# Patient Record
Sex: Female | Born: 1935 | Race: Black or African American | Hispanic: No | Marital: Married | Smoking: Never smoker
Health system: Southern US, Community
[De-identification: ages and names within clinical notes are randomized; demographics above are authoritative.]

## PROBLEM LIST (undated history)

## (undated) DIAGNOSIS — E119 Type 2 diabetes mellitus without complications: Secondary | ICD-10-CM

---

## 1998-07-28 ENCOUNTER — Ambulatory Visit (HOSPITAL_COMMUNITY): Admission: RE | Admit: 1998-07-28 | Discharge: 1998-07-28 | Payer: Self-pay

## 1999-08-17 ENCOUNTER — Ambulatory Visit (HOSPITAL_COMMUNITY): Admission: RE | Admit: 1999-08-17 | Discharge: 1999-08-17 | Payer: Self-pay | Admitting: Hematology and Oncology

## 1999-08-17 ENCOUNTER — Encounter: Payer: Self-pay | Admitting: Hematology and Oncology

## 1999-09-27 ENCOUNTER — Encounter: Admission: RE | Admit: 1999-09-27 | Discharge: 1999-12-26 | Payer: Self-pay | Admitting: Internal Medicine

## 2000-02-02 ENCOUNTER — Encounter: Admission: RE | Admit: 2000-02-02 | Discharge: 2000-05-02 | Payer: Self-pay | Admitting: Internal Medicine

## 2000-02-03 ENCOUNTER — Encounter: Admission: RE | Admit: 2000-02-03 | Discharge: 2000-05-03 | Payer: Self-pay | Admitting: Internal Medicine

## 2000-08-17 ENCOUNTER — Encounter: Payer: Self-pay | Admitting: Hematology and Oncology

## 2000-08-17 ENCOUNTER — Ambulatory Visit (HOSPITAL_COMMUNITY): Admission: RE | Admit: 2000-08-17 | Discharge: 2000-08-17 | Payer: Self-pay | Admitting: Hematology and Oncology

## 2000-10-24 ENCOUNTER — Encounter: Admission: RE | Admit: 2000-10-24 | Discharge: 2001-01-22 | Payer: Self-pay | Admitting: Internal Medicine

## 2001-08-13 ENCOUNTER — Encounter: Payer: Self-pay | Admitting: Hematology and Oncology

## 2001-08-13 ENCOUNTER — Ambulatory Visit (HOSPITAL_COMMUNITY): Admission: RE | Admit: 2001-08-13 | Discharge: 2001-08-13 | Payer: Self-pay | Admitting: *Deleted

## 2002-08-16 ENCOUNTER — Encounter: Payer: Self-pay | Admitting: Internal Medicine

## 2002-08-16 ENCOUNTER — Ambulatory Visit (HOSPITAL_COMMUNITY): Admission: RE | Admit: 2002-08-16 | Discharge: 2002-08-16 | Payer: Self-pay | Admitting: Hematology and Oncology

## 2003-08-18 ENCOUNTER — Ambulatory Visit (HOSPITAL_COMMUNITY): Admission: RE | Admit: 2003-08-18 | Discharge: 2003-08-18 | Payer: Self-pay | Admitting: Internal Medicine

## 2004-08-18 ENCOUNTER — Ambulatory Visit (HOSPITAL_COMMUNITY): Admission: RE | Admit: 2004-08-18 | Discharge: 2004-08-18 | Payer: Self-pay | Admitting: Internal Medicine

## 2005-02-15 ENCOUNTER — Ambulatory Visit (HOSPITAL_COMMUNITY): Admission: RE | Admit: 2005-02-15 | Discharge: 2005-02-15 | Payer: Self-pay | Admitting: Internal Medicine

## 2005-07-27 ENCOUNTER — Ambulatory Visit (HOSPITAL_COMMUNITY): Admission: RE | Admit: 2005-07-27 | Discharge: 2005-07-27 | Payer: Self-pay | Admitting: Internal Medicine

## 2005-08-15 ENCOUNTER — Encounter: Admission: RE | Admit: 2005-08-15 | Discharge: 2005-08-15 | Payer: Self-pay | Admitting: Internal Medicine

## 2005-09-19 ENCOUNTER — Encounter: Admission: RE | Admit: 2005-09-19 | Discharge: 2005-09-19 | Payer: Self-pay | Admitting: Internal Medicine

## 2007-01-16 ENCOUNTER — Ambulatory Visit (HOSPITAL_COMMUNITY): Admission: RE | Admit: 2007-01-16 | Discharge: 2007-01-16 | Payer: Self-pay | Admitting: Internal Medicine

## 2008-01-17 ENCOUNTER — Ambulatory Visit (HOSPITAL_COMMUNITY): Admission: RE | Admit: 2008-01-17 | Discharge: 2008-01-17 | Payer: Self-pay | Admitting: Internal Medicine

## 2009-01-19 ENCOUNTER — Ambulatory Visit (HOSPITAL_COMMUNITY): Admission: RE | Admit: 2009-01-19 | Discharge: 2009-01-19 | Payer: Self-pay | Admitting: Surgical Oncology

## 2010-02-17 ENCOUNTER — Ambulatory Visit (HOSPITAL_COMMUNITY): Admission: RE | Admit: 2010-02-17 | Discharge: 2010-02-17 | Payer: Self-pay | Admitting: Internal Medicine

## 2010-06-13 ENCOUNTER — Encounter: Payer: Self-pay | Admitting: Internal Medicine

## 2011-03-15 ENCOUNTER — Other Ambulatory Visit (HOSPITAL_COMMUNITY): Payer: Self-pay | Admitting: Family Medicine

## 2011-03-15 DIAGNOSIS — Z1231 Encounter for screening mammogram for malignant neoplasm of breast: Secondary | ICD-10-CM

## 2011-04-06 ENCOUNTER — Ambulatory Visit (HOSPITAL_COMMUNITY)
Admission: RE | Admit: 2011-04-06 | Discharge: 2011-04-06 | Disposition: A | Discharge status: Home / Self Care | Payer: Medicare HMO | Source: Ambulatory Visit | Attending: Family Medicine | Admitting: Family Medicine

## 2011-04-06 DIAGNOSIS — Z1231 Encounter for screening mammogram for malignant neoplasm of breast: Secondary | ICD-10-CM

## 2013-04-04 ENCOUNTER — Other Ambulatory Visit (HOSPITAL_COMMUNITY): Payer: Self-pay | Admitting: Internal Medicine

## 2013-04-04 DIAGNOSIS — Z1231 Encounter for screening mammogram for malignant neoplasm of breast: Secondary | ICD-10-CM

## 2013-04-19 ENCOUNTER — Ambulatory Visit (HOSPITAL_COMMUNITY): Payer: Medicare Other

## 2013-05-08 ENCOUNTER — Ambulatory Visit (HOSPITAL_COMMUNITY)
Admission: RE | Admit: 2013-05-08 | Discharge: 2013-05-08 | Disposition: A | Discharge status: Home / Self Care | Payer: Medicare Other | Source: Ambulatory Visit | Attending: Internal Medicine | Admitting: Internal Medicine

## 2013-05-08 DIAGNOSIS — Z1231 Encounter for screening mammogram for malignant neoplasm of breast: Secondary | ICD-10-CM | POA: Insufficient documentation

## 2014-07-29 ENCOUNTER — Other Ambulatory Visit (HOSPITAL_BASED_OUTPATIENT_CLINIC_OR_DEPARTMENT_OTHER): Payer: Self-pay | Admitting: Internal Medicine

## 2014-07-29 DIAGNOSIS — Z1231 Encounter for screening mammogram for malignant neoplasm of breast: Secondary | ICD-10-CM

## 2014-08-05 ENCOUNTER — Ambulatory Visit (HOSPITAL_BASED_OUTPATIENT_CLINIC_OR_DEPARTMENT_OTHER): Payer: Medicare HMO

## 2018-01-27 ENCOUNTER — Encounter (HOSPITAL_COMMUNITY): Payer: Self-pay | Admitting: Emergency Medicine

## 2018-01-27 ENCOUNTER — Emergency Department (HOSPITAL_COMMUNITY): Payer: Medicare Other

## 2018-01-27 ENCOUNTER — Emergency Department (HOSPITAL_COMMUNITY)
Admission: EM | Admit: 2018-01-27 | Discharge: 2018-01-27 | Disposition: A | Discharge status: Home / Self Care | Payer: Medicare Other | Attending: Emergency Medicine | Admitting: Emergency Medicine

## 2018-01-27 DIAGNOSIS — S32592A Other specified fracture of left pubis, initial encounter for closed fracture: Secondary | ICD-10-CM | POA: Insufficient documentation

## 2018-01-27 DIAGNOSIS — Y998 Other external cause status: Secondary | ICD-10-CM | POA: Diagnosis not present

## 2018-01-27 DIAGNOSIS — Y929 Unspecified place or not applicable: Secondary | ICD-10-CM | POA: Diagnosis not present

## 2018-01-27 DIAGNOSIS — E119 Type 2 diabetes mellitus without complications: Secondary | ICD-10-CM | POA: Insufficient documentation

## 2018-01-27 DIAGNOSIS — Y9389 Activity, other specified: Secondary | ICD-10-CM | POA: Insufficient documentation

## 2018-01-27 DIAGNOSIS — S79912A Unspecified injury of left hip, initial encounter: Secondary | ICD-10-CM | POA: Diagnosis present

## 2018-01-27 DIAGNOSIS — W010XXA Fall on same level from slipping, tripping and stumbling without subsequent striking against object, initial encounter: Secondary | ICD-10-CM | POA: Diagnosis not present

## 2018-01-27 DIAGNOSIS — W19XXXA Unspecified fall, initial encounter: Secondary | ICD-10-CM

## 2018-01-27 HISTORY — DX: Type 2 diabetes mellitus without complications: E11.9

## 2018-01-27 MED ORDER — HYDROCODONE-ACETAMINOPHEN 5-325 MG PO TABS: 1.0000 | ORAL_TABLET | Freq: Four times a day (QID) | ORAL | 0 refills | Status: DC | PRN

## 2018-01-27 MED ORDER — HYDROCODONE-ACETAMINOPHEN 5-325 MG PO TABS
1.0000 | ORAL_TABLET | Freq: Once | ORAL | Status: AC
  Administered 2018-01-27: 1 via ORAL
  Filled 2018-01-27: qty 1

## 2018-01-27 NOTE — ED Provider Notes (Signed)
[facility] DEPT Provider Note   CSN: [id number] Arrival date & time: 01/27/18  1628     History   Chief Complaint Chief Complaint  Patient presents with  . Fall  . Hip Pain  . Leg Pain  . Ankle Pain    HPI Mary Aguirre is a 84 y.o. female.  HPI  84 year old female presents with left-sided leg and hip pain.  She fell 2 days ago while trying to get ice out of the freezer.  Has been unable to bear weight since.  Has tried Tylenol with partial relief.  However still in severe pain.  No known head injury and she is otherwise been well.  Has not been sick recently.  Pain is worst in the hip but diffuse throughout her left leg.  Also some back pain.  Past Medical History:  Diagnosis Date  . Diabetes mellitus without complication ([place])     There are no active problems to display for this patient.      OB History   None      Home Medications    Prior to Admission medications   Medication Sig Start Date End Date Taking? Authorizing Provider  HYDROcodone-acetaminophen (NORCO) 5-325 MG tablet Take 1 tablet by mouth every 6 (six) hours as needed for severe pain. 01/27/18   Sherwood Gambler, MD    Family History No family history on file.  Social History Social History   Tobacco Use  . Smoking status: Never Smoker  . Smokeless tobacco: Never Used  Substance Use Topics  . Alcohol use: Never    Frequency: Never  . Drug use: Never     Allergies   Iohexol   Review of Systems Review of Systems  Musculoskeletal: Positive for arthralgias and back pain.  Skin: Negative for wound.  Neurological: Negative for headaches.     Physical Exam Updated Vital Signs BP (!) 170/69   Pulse 81   Temp 98 F (36.7 C) (Oral)   Resp 17   SpO2 96%   Physical Exam  Constitutional: She appears well-developed and well-nourished.  HENT:  Head: Normocephalic and atraumatic.  Right Ear: External ear normal.  Left Ear: External ear normal.  Nose:  Nose normal.  Eyes: Right eye exhibits no discharge. Left eye exhibits no discharge.  Cardiovascular: Normal rate, regular rhythm and normal heart sounds.  Pulmonary/Chest: Effort normal and breath sounds normal.  Abdominal: Soft. She exhibits no distension. There is no tenderness.  Musculoskeletal:       Left hip: She exhibits decreased range of motion and tenderness.       Left knee: She exhibits no swelling. No tenderness found.       Left ankle: She exhibits normal range of motion and no swelling. Tenderness.       Cervical back: She exhibits no tenderness.       Thoracic back: She exhibits tenderness.       Lumbar back: She exhibits tenderness.       Left upper leg: She exhibits tenderness.       Left lower leg: She exhibits tenderness.       Left foot: There is tenderness.  Neurological: She is alert.  Skin: Skin is warm and dry.  Nursing note and vitals reviewed.    ED Treatments / Results  Labs (all labs ordered are listed, but only abnormal results are displayed) Labs Reviewed - No data to display  EKG None  Radiology Dg Thoracic Spine W/swimmers  Result  Date: 01/27/2018 CLINICAL DATA:  Pain after fall last week. EXAM: THORACIC SPINE - 3 VIEWS COMPARISON:  None. FINDINGS: Limited lateral views of the thoracic spine as images had to be acquired with the patient supine using cross-table technique due to patient's condition. There is moderate atherosclerosis of the thoracic aorta without aneurysm. Slight levoconvex curvature of the thoracic spine at the thoracolumbar junction. Thoracic spondylosis is noted with suggestion of slight multilevel degenerative inferior endplate compression and/or height loss from T7 through T10 which are age indeterminate but likely chronic given the multilevel disc flattening noted. IMPRESSION: Thoracic spondylosis with likely remote mild inferior endplate depressions of T7 through T10 suggested on the frontal view. The lateral views are limited in  utility due to the cross-table lateral technique that had to be utilized due to patient's condition. Electronically Signed   By: Ashley Royalty M.D.   On: 01/27/2018 19:36   Dg Lumbar Spine Complete  Result Date: 01/27/2018 CLINICAL DATA:  Left-sided pain after fall 1 week ago. EXAM: LUMBAR SPINE - COMPLETE 4+ VIEW COMPARISON:  CT 02/15/2005 and hip radiographs 10/13/2016 FINDINGS: Six views of the lumbar spine were provided however the lateral view is incomplete as the only compass is the inferior endplate L3 through mid coccyx. Conventional segmentation of the lumbar spine. Moderate nonaneurysmal aortoiliac atherosclerosis. There are acute slightly displaced left superior and inferior pubic rami fractures better visualized on the pelvic and hip radiographs. Chain sutures and surgical clips project over the midline lower pelvis. Sclerosis and slight erosive change about both SI joints compatible with chronic sacroiliitis. IMPRESSION: 1. Incomplete views of the upper lumbar spine on the lateral view as and excludes the upper lumbar spine from mid L3 cephalad. This was due to the patient's condition per technologist report and limited ability to position adequately. On the frontal projection however, no apparent fracture or suspicious osseous lesions. 2. Sclerosis and slight erosive change of both sacroiliac joints likely reflecting chronic sacroiliitis. 3. Left superior and inferior pubic rami fractures, acute in appearance with slight displacement. Electronically Signed   By: Ashley Royalty M.D.   On: 01/27/2018 19:32   Dg Tibia/fibula Left  Result Date: 01/27/2018 CLINICAL DATA:  Leg pain after fall 1 week ago. EXAM: LEFT TIBIA AND FIBULA - 2 VIEW COMPARISON:  None. FINDINGS: Osteopenic appearance of the tibia and fibula with vascular calcifications identified along the course the tibial arteries. Osteoarthritic joint space narrowing of the femorotibial and tibiotalar joints. Psych cortical depression of the  proximal tibial metadiaphysis along its medial aspect may reflect old remote trauma. Small calcaneal enthesophytes are noted. IMPRESSION: Osteopenia without acute appearing fracture or joint dislocation. Mild osteoarthritic joint space narrowing of the included knee and ankle joints. Electronically Signed   By: Ashley Royalty M.D.   On: 01/27/2018 19:43   Dg Ankle Complete Left  Result Date: 01/27/2018 CLINICAL DATA:  Ankle pain after fall 1 week ago. EXAM: LEFT ANKLE COMPLETE - 3+ VIEW COMPARISON:  None. FINDINGS: Osteopenic appearance of the left ankle with generalized soft tissue swelling of the included leg, ankle and foot. Calcaneal enthesopathy is seen more so along the posterior aspect. Joint spaces are maintained without effusion. No fracture is visualized. Tibial arteriosclerosis is noted. IMPRESSION: Generalized soft tissue swelling of the left ankle without acute osseous abnormality. Osteopenia. Electronically Signed   By: Ashley Royalty M.D.   On: 01/27/2018 19:45   Dg Foot Complete Left  Result Date: 01/27/2018 CLINICAL DATA:  Left foot pain after fall last  week. EXAM: LEFT FOOT - COMPLETE 3+ VIEW COMPARISON:  None. FINDINGS: Generalized osteopenic appearance of the foot and included ankle. Generalized soft tissue swelling is noted without fracture or joint dislocation. Dorsal calcaneal enthesophyte is identified. No ankle or subtalar joint effusion. Osteoarthritic joint space narrowing of the DIP and PIP joints of the toes, MTP articulations and interphalangeal joint of the great toe. Accessory ossicle is identified adjacent to the cuboid. IMPRESSION: 1. Diffuse soft tissue swelling of the ankle and foot possibly from vascular insufficiency or third spacing. 2. Osteopenic appearance of the included ankle and foot without fracture. 3. Osteoarthritis of the toes. Electronically Signed   By: Ashley Royalty M.D.   On: 01/27/2018 19:40   Dg Hip Unilat With Pelvis 2-3 Views Left  Result Date:  01/27/2018 CLINICAL DATA:  Patient fell 1 week ago and presents with left-sided pai. N EXAM: DG HIP (WITH OR WITHOUT PELVIS) 2-3V LEFT COMPARISON:  10/13/2016 FINDINGS: Acute slightly displaced left superior and inferior pubic rami fractures. The bony pelvis appears largely intact given limitations due to overlying bowel. Surgical clips project over the lower mid pelvis as before. Osteoarthritic joint space narrowing of both hips. No acute proximal femoral or acetabular fracture. Chronic sclerosis and slight erosive change about both SI joints suspicious for chronic changes of sacroiliitis. IMPRESSION: 1. Acute left inferior and superior pubic rami fractures with minimal displacement. 2. Osteoarthritis of both hips without acute proximal femoral nor acetabular fracture. 3. Chronic sclerosis and mild erosive change about the SI joints bilaterally suspicious for sacroiliitis. Electronically Signed   By: Ashley Royalty M.D.   On: 01/27/2018 19:27   Dg Femur Min 2 Views Left  Result Date: 01/27/2018 CLINICAL DATA:  Pain after fall 1 week ago EXAM: LEFT FEMUR 2 VIEWS COMPARISON:  None. FINDINGS: Acute, closed minimally displaced inferior and superior left-sided pubic rami fractures are noted. Osteoarthritis of the left hip joint with joint space narrowing. No fracture of the femur is identified. Mild joint space narrowing of the femorotibial compartment. Femoral arteriosclerosis is noted. Focal soft tissue swelling. Chain sutures and surgical clips project over the lower pelvis. IMPRESSION: Acute minimally displaced left inferior and superior pubic rami fractures. Osteoarthritis of the left hip and knee. No femoral fracture. Electronically Signed   By: Ashley Royalty M.D.   On: 01/27/2018 19:47    Procedures Procedures (including critical care time)  Medications Ordered in ED Medications  HYDROcodone-acetaminophen (NORCO/VICODIN) 5-325 MG per tablet 1 tablet (1 tablet Oral Given 01/27/18 1922)     Initial  Impression / Assessment and Plan / ED Course  I have reviewed the triage vital signs and the nursing notes.  Pertinent labs & imaging results that were available during my care of the patient were reviewed by me and considered in my medical decision making (see chart for details).     Patient's x-ray shows left inferior and superior pubic rami fractures.  However no hip fracture.  Other x-rays are either equivocal or no fracture.  I think all of this is related to the pubic rami fractures.  After hydrocodone, she is able to ambulate with a walker without difficulty.  She has a walker at home.  Discussed with Dr. Lorin Mercy he will follow-up in clinic and she is weightbearing as tolerated.  Otherwise no acute medical complaints and appears stable for discharge home.  Final Clinical Impressions(s) / ED Diagnoses   Final diagnoses:  Fall, initial encounter  Fracture of multiple pubic rami, left, closed, initial encounter (  [facility])    ED Discharge Orders         Ordered    HYDROcodone-acetaminophen (NORCO) 5-325 MG tablet  Every 6 hours PRN     01/27/18 2052           Sherwood Gambler, MD 01/27/18 2334

## 2018-01-27 NOTE — Discharge Instructions (Addendum)
If your pain worsens or becomes unbearable or you are unable to walk, or you develop any other new/concerning symptoms then return to the ER for evaluation.  Otherwise follow-up with the orthopedist listed.

## 2018-01-27 NOTE — ED Triage Notes (Signed)
Patient here from home with complaints of left sided pain after fall x1 week. Reports left hip, leg, and ankle pain.

## 2018-01-27 NOTE — ED Notes (Signed)
Patient transported to X-ray 

## 2018-04-18 ENCOUNTER — Emergency Department (HOSPITAL_COMMUNITY): Payer: Medicare Other

## 2018-04-18 ENCOUNTER — Encounter (HOSPITAL_COMMUNITY): Payer: Self-pay

## 2018-04-18 ENCOUNTER — Observation Stay (HOSPITAL_COMMUNITY)
Admission: EM | Admit: 2018-04-18 | Discharge: 2018-04-23 | Disposition: A | Discharge status: Skilled Nursing Facility | Payer: Medicare Other | Attending: Internal Medicine | Admitting: Internal Medicine

## 2018-04-18 ENCOUNTER — Other Ambulatory Visit: Payer: Self-pay

## 2018-04-18 DIAGNOSIS — Z992 Dependence on renal dialysis: Secondary | ICD-10-CM | POA: Diagnosis not present

## 2018-04-18 DIAGNOSIS — S32591A Other specified fracture of right pubis, initial encounter for closed fracture: Secondary | ICD-10-CM | POA: Diagnosis present

## 2018-04-18 DIAGNOSIS — Z79899 Other long term (current) drug therapy: Secondary | ICD-10-CM | POA: Diagnosis not present

## 2018-04-18 DIAGNOSIS — R296 Repeated falls: Secondary | ICD-10-CM | POA: Diagnosis not present

## 2018-04-18 DIAGNOSIS — M79671 Pain in right foot: Secondary | ICD-10-CM

## 2018-04-18 DIAGNOSIS — Z794 Long term (current) use of insulin: Secondary | ICD-10-CM | POA: Diagnosis not present

## 2018-04-18 DIAGNOSIS — N186 End stage renal disease: Secondary | ICD-10-CM | POA: Diagnosis present

## 2018-04-18 DIAGNOSIS — E118 Type 2 diabetes mellitus with unspecified complications: Secondary | ICD-10-CM | POA: Diagnosis not present

## 2018-04-18 DIAGNOSIS — S32592A Other specified fracture of left pubis, initial encounter for closed fracture: Principal | ICD-10-CM | POA: Insufficient documentation

## 2018-04-18 DIAGNOSIS — I120 Hypertensive chronic kidney disease with stage 5 chronic kidney disease or end stage renal disease: Secondary | ICD-10-CM | POA: Insufficient documentation

## 2018-04-18 DIAGNOSIS — S32511A Fracture of superior rim of right pubis, initial encounter for closed fracture: Secondary | ICD-10-CM

## 2018-04-18 DIAGNOSIS — W19XXXA Unspecified fall, initial encounter: Secondary | ICD-10-CM | POA: Diagnosis not present

## 2018-04-18 DIAGNOSIS — E1122 Type 2 diabetes mellitus with diabetic chronic kidney disease: Secondary | ICD-10-CM | POA: Insufficient documentation

## 2018-04-18 DIAGNOSIS — S329XXA Fracture of unspecified parts of lumbosacral spine and pelvis, initial encounter for closed fracture: Secondary | ICD-10-CM | POA: Diagnosis present

## 2018-04-18 DIAGNOSIS — W010XXA Fall on same level from slipping, tripping and stumbling without subsequent striking against object, initial encounter: Secondary | ICD-10-CM | POA: Diagnosis not present

## 2018-04-18 DIAGNOSIS — D631 Anemia in chronic kidney disease: Secondary | ICD-10-CM | POA: Diagnosis not present

## 2018-04-18 DIAGNOSIS — I10 Essential (primary) hypertension: Secondary | ICD-10-CM

## 2018-04-18 LAB — CBC WITH DIFFERENTIAL/PLATELET
Abs Immature Granulocytes: 0.05 10*3/uL (ref 0.00–0.07)
BASOS PCT: 0 %
Basophils Absolute: 0 10*3/uL (ref 0.0–0.1)
EOS PCT: 1 %
Eosinophils Absolute: 0.1 10*3/uL (ref 0.0–0.5)
HCT: 30.2 % — ABNORMAL LOW (ref 36.0–46.0)
Hemoglobin: 9 g/dL — ABNORMAL LOW (ref 12.0–15.0)
Immature Granulocytes: 1 %
LYMPHS ABS: 2 10*3/uL (ref 0.7–4.0)
Lymphocytes Relative: 21 %
MCH: 28 pg (ref 26.0–34.0)
MCHC: 29.8 g/dL — AB (ref 30.0–36.0)
MCV: 93.8 fL (ref 80.0–100.0)
MONO ABS: 0.7 10*3/uL (ref 0.1–1.0)
Monocytes Relative: 7 %
NRBC: 0 % (ref 0.0–0.2)
Neutro Abs: 6.6 10*3/uL (ref 1.7–7.7)
Neutrophils Relative %: 70 %
PLATELETS: 383 10*3/uL (ref 150–400)
RBC: 3.22 MIL/uL — AB (ref 3.87–5.11)
RDW: 14.5 % (ref 11.5–15.5)
WBC: 9.4 10*3/uL (ref 4.0–10.5)

## 2018-04-18 LAB — BASIC METABOLIC PANEL
Anion gap: 10 (ref 5–15)
BUN: 14 mg/dL (ref 8–23)
CO2: 26 mmol/L (ref 22–32)
CREATININE: 2.65 mg/dL — AB (ref 0.44–1.00)
Calcium: 9.7 mg/dL (ref 8.9–10.3)
Chloride: 103 mmol/L (ref 98–111)
GFR calc Af Amer: 18 mL/min — ABNORMAL LOW (ref >60)
GFR, EST NON AFRICAN AMERICAN: 15 mL/min — AB (ref >60)
Glucose, Bld: 114 mg/dL — ABNORMAL HIGH (ref 70–99)
Potassium: 3.6 mmol/L (ref 3.5–5.1)
SODIUM: 139 mmol/L (ref 135–145)

## 2018-04-18 MED ORDER — FENTANYL CITRATE (PF) 100 MCG/2ML IJ SOLN
50.0000 ug | Freq: Once | INTRAMUSCULAR | Status: AC
  Administered 2018-04-18: 50 ug via INTRAVENOUS
  Filled 2018-04-18: qty 2

## 2018-04-18 NOTE — H&P (Signed)
Mary Aguirre MHD:[id number] DOB: 29-Mar-1927 DOA: 04/18/2018     PCP: Drake Leach, MD   Outpatient Specialists:    NEphrology: at [facility].    Patient arrived to ER on 04/18/18 at 1750  Patient coming from: home Lives   With family    Chief Complaint:  Chief Complaint  Patient presents with  . Fall    HPI: Mary Aguirre is a 84 y.o. female with medical history significant of ESRD on HD, DM 2, HTN, anemia, hx of rectal cancer, hx of breast ca    Presented with a fall today while at HD.  Patient finished her hemodialysis session for today . she was sitting on her [place] reached over to the table to get apply slipped out of a walker and tumbled onto the floor and has recurrent falls and recently has been seen for fall with pelvic fracture few weeks ago that time she was standing up on a chair and trying to adjust curtains and fell down. Currently she is unable to ambulate reporting significant right hip pain denies LOC or head injury Reports this was a slip and fall landed on both knees  Reports her right toe hurts as well    Regarding pertinent Chronic problems:  Dm 2 on insulin  on HD M,W,F for the past 1 month still makes urine  While in ER: Got fentanyl IV with only minimal response continues to have significant pain and unable to ambulate The following Work up has been ordered so far:  Orders Placed This Encounter  Procedures  . DG Knee Complete 4 Views Left  . DG Knee Complete 4 Views Right  . DG Hip Unilat W or Wo Pelvis 2-3 Views Right  . CT Knee Right Wo Contrast  . CT Hip Right Wo Contrast  . CBC with Differential/Platelet  . Basic metabolic panel  . Ambulate patient  . Consult for [facility] Admission     Following Medications were ordered in ER: Medications  fentaNYL (SUBLIMAZE) injection 50 mcg (50 mcg Intravenous Given 04/18/18 2014)  fentaNYL (SUBLIMAZE) injection 50 mcg (50 mcg Intravenous Given 04/18/18 2257)     Significant initial  Findings: Abnormal Labs Reviewed  CBC WITH DIFFERENTIAL/PLATELET - Abnormal; Notable for the following components:      Result Value   RBC 3.22 (*)    Hemoglobin 9.0 (*)    HCT 30.2 (*)    MCHC 29.8 (*)    All other components within normal limits  BASIC METABOLIC PANEL - Abnormal; Notable for the following components:   Glucose, Bld 114 (*)    Creatinine, Ser 2.65 (*)    GFR calc non Af Amer 15 (*)    GFR calc Af Amer 18 (*)    All other components within normal limits     Lactic Acid, Venous No results found for: LATICACIDVEN  Na 139 K 3.6  Cr  Lab Results  Component Value Date   CREATININE 2.65 (H) 04/18/2018     WBC 9.4   HG/HCT stable,     Component Value Date/Time   HGB 9.0 (L) 04/18/2018 2007   HCT 30.2 (L) 04/18/2018 2007     Troponin (Point of Care Test) No results for input(s): TROPIPOC in the last 72 hours.   BNP (last 3 results) No results for input(s): BNP in the last 8760 hours.  ProBNP (last 3 results) No results for input(s): PROBNP in the last 8760 hours.     UA  not ordered    Left knee nonacute Right knee films and CT nonacute Pelvis chronic fractures of left superior and inferior pubic rami unchanged  CT right hip showing acute mildly displaced fracture of the right superior pubic ramus acute nondisplaced right inferior pubic ramus fracture subacute remote left pubic rami fractures soft tissue contusion of the right hip  ECG: not obtained    ED Triage Vitals  Enc Vitals Group     BP 04/18/18 1805 (!) 159/88     Pulse Rate 04/18/18 1805 (!) 107     Resp 04/18/18 1805 15     Temp 04/18/18 1805 98.7 F (37.1 C)     Temp Source 04/18/18 1805 Oral     SpO2 04/18/18 1805 99 %     Weight --      Height --      Head Circumference --      Peak Flow --      Pain Score 04/18/18 1804 10     Pain Loc --      Pain Edu? --      Excl. in [place]? --   TMAX(24)@       Latest  Blood pressure (!) 156/63, pulse  99, temperature 98.7 F (37.1 C), temperature source Oral, resp. rate 15, SpO2 93 %.   Hospitalist was called for admission for pain management of multiple pubic remi fractures Breast cancer sp mastectomy on left side  Review of Systems:    Pertinent positives include: fall, pelvic pain   Constitutional:  No weight loss, night sweats, Fevers, chills, fatigue, weight loss  HEENT:  No headaches, Difficulty swallowing,Tooth/dental problems,Sore throat,  No sneezing, itching, ear ache, nasal congestion, post nasal drip,  Cardio-vascular:  No chest pain, Orthopnea, PND, anasarca, dizziness, palpitations.no Bilateral lower extremity swelling  GI:  No heartburn, indigestion, abdominal pain, nausea, vomiting, diarrhea, change in bowel habits, loss of appetite, melena, blood in stool, hematemesis Resp:  no shortness of breath at rest. No dyspnea on exertion, No excess mucus, no productive cough, No non-productive cough, No coughing up of blood.No change in color of mucus.No wheezing. Skin:  no rash or lesions. No jaundice GU:  no dysuria, change in color of urine, no urgency or frequency. No straining to urinate.  No flank pain.  Musculoskeletal:  No joint pain or no joint swelling. No decreased range of motion. No back pain.  Psych:  No change in mood or affect. No depression or anxiety. No memory loss.  Neuro: no localizing neurological complaints, no tingling, no weakness, no double vision, no gait abnormality, no slurred speech, no confusion  All systems reviewed and apart from [place] all are negative  Past Medical History:   Past Medical History:  Diagnosis Date  . Diabetes mellitus without complication ([place])       History reviewed. No pertinent surgical history.  Social History:  Ambulatory  Gilford Rile     reports that she has never smoked. She has never used smokeless tobacco. She reports that she does not drink alcohol or use drugs.    Family History:   Family History   Problem Relation Age of Onset  . Colon cancer Brother   . Stroke Neg Hx     Allergies: Allergies  Allergen Reactions  . Cinacalcet Other (See Comments)  . Ramipril Other (See Comments)    unknown  . Iohexol      Desc: hives;pt pre-medicated with 13 hr prep did fine-02/15/05      Prior to  Admission medications   Medication Sig Start Date End Date Taking? Authorizing Provider  amLODipine (NORVASC) 5 MG tablet Take 5 mg by mouth daily. 02/15/18  Yes [provider]  atorvastatin (LIPITOR) 40 MG tablet Take 40 mg by mouth at bedtime. 01/23/18  Yes [provider]  bumetanide (BUMEX) 2 MG tablet Take 2 mg by mouth daily. 01/23/18  Yes [provider]  Insulin Detemir (LEVEMIR FLEXTOUCH) 100 UNIT/ML Pen Inject 30 Units into the skin at bedtime. 02/13/18  Yes [provider]  HYDROcodone-acetaminophen (NORCO) 5-325 MG tablet Take 1 tablet by mouth every 6 (six) hours as needed for severe pain. Patient not taking: Reported on 04/18/2018 01/27/18   Sherwood Gambler, MD   Physical Exam: Blood pressure (!) 156/63, pulse 99, temperature 98.7 F (37.1 C), temperature source Oral, resp. rate 15, SpO2 93 %. 1. General:  in No Acute distress  Chronically ill  -appearing 2. Psychological: Alert and    Oriented 3. Head/ENT:   Moist   Mucous Membranes                          Head Non traumatic, neck supple                        Poor Dentition 4. SKIN:   decreased Skin turgor,  Skin clean Dry and intact no rash 5. Heart: Regular rate and rhythm no  Murmur, no Rub or gallop 6. Lungs: Clear to auscultation bilaterally, no wheezes or crackles   7. Abdomen: Soft, non-tender, Non distended   obese  bowel sounds present 8. Lower extremities: no clubbing, cyanosis, or edema right great toe erythema slight tenderness to palpation over first metatarsal joint 9. Neurologically Grossly intact, moving all 4 extremities equally  10. MSK: Normal range of motion   LABS:      Recent Labs  Lab 04/18/18 2007  WBC 9.4  NEUTROABS 6.6  HGB 9.0*  HCT 30.2*  MCV 93.8  PLT 370   Basic Metabolic Panel: Recent Labs  Lab 04/18/18 2007  NA 139  K 3.6  CL 103  CO2 26  GLUCOSE 114*  BUN 14  CREATININE 2.65*  CALCIUM 9.7     No results for input(s): AST, ALT, ALKPHOS, BILITOT, PROT, ALBUMIN in the last 168 hours. No results for input(s): LIPASE, AMYLASE in the last 168 hours. No results for input(s): AMMONIA in the last 168 hours.    HbA1C: No results for input(s): HGBA1C in the last 72 hours. CBG: No results for input(s): GLUCAP in the last 168 hours.    Urine analysis: No results found for: COLORURINE, APPEARANCEUR, LABSPEC, PHURINE, GLUCOSEU, HGBUR, BILIRUBINUR, KETONESUR, PROTEINUR, UROBILINOGEN, NITRITE, LEUKOCYTESUR     Cultures: No results found for: SDES, [place], CULT, REPTSTATUS   Radiological Exams on Admission: Ct Knee Right Wo Contrast  Result Date: 04/18/2018 CLINICAL DATA:  Fall with right-sided knee pain EXAM: CT OF THE right KNEE WITHOUT CONTRAST TECHNIQUE: Multidetector CT imaging of the right knee was performed according to the standard protocol. Multiplanar CT image reconstructions were also generated. COMPARISON:  04/18/2018, 04/02/2018 FINDINGS: Bones/Joint/Cartilage No acute displaced fracture or malalignment. Mild patellofemoral degenerative changes. Mild medial and lateral joint space degenerative change. No significant knee effusion. Small cortical defect proximal metaphysis of the tibia, corresponding to radiographic abnormality. This could be secondary to remote trauma. No associated soft tissue mass. No periostitis. Ligaments Suboptimally assessed by CT. Muscles and Tendons  Quadriceps and patellar tendon appear intact. No intramuscular hematoma. Mild muscular atrophy. Soft tissues Vascular calcifications in the popliteal fossa. IMPRESSION: 1. No acute displaced fracture or malalignment 2. Mild arthritis of the knee 3.  Small cortical defect proximal metaphysis of the tibia without associated soft tissue mass. This is of uncertain etiology, possibly a small focus of remote trauma. Electronically Signed   By: Donavan Foil M.D.   On: 04/18/2018 23:10   Ct Hip Right Wo Contrast  Result Date: 04/18/2018 CLINICAL DATA:  84 year old post fall while at dialysis today. Right pelvic pain. EXAM: CT OF THE RIGHT HIP WITHOUT CONTRAST TECHNIQUE: Multidetector CT imaging of the right hip was performed according to the standard protocol. Multiplanar CT image reconstructions were also generated. COMPARISON:  Radiograph earlier this day. Pelvis and right hip radiographs 04/02/2018 FINDINGS: Bones/Joint/Cartilage Acute mildly displaced fracture of the right puboacetabular junction. This does not extend to the acetabular articular cortex. Acute the right femoral head is well seated. Nondisplaced right inferior pubic ramus fracture. Full field of view imaging of the pelvis demonstrates a comminuted left superior pubic ramus fracture and displaced left inferior pubis ramus fracture, seen on 01/27/2018 radiograph. Fracture component at the left puboacetabular junction is likely chronic but technically age indeterminate. Bilateral sacroiliac joint widening appears chronic and may be secondary to renal osteodystrophy. There is pubic symphyseal widening which is also chronic. Diffuse bony under mineralization. Ligaments Suboptimally assessed by CT. Muscles and Tendons No intramuscular hematoma.  Enthesopathy at the greater trochanter. Soft tissues Soft tissue contusion lateral to the right hip. Colonic diverticulosis. Advanced pelvic vascular calcifications. Enteric sutures in the rectosigmoid colon. IMPRESSION: 1. Acute mildly displaced fracture of the right superior pubic ramus at the puboacetabular junction. Acute nondisplaced right inferior pubic ramus fracture. 2. Subacute/remote left pubic rami fractures, present since at least 01/27/2018 left  hip radiograph. 3. Soft tissue contusion lateral to the right hip. 4. Additional chronic findings as described. Electronically Signed   By: Keith Rake M.D.   On: 04/18/2018 23:09   Dg Knee Complete 4 Views Left  Result Date: 04/18/2018 CLINICAL DATA:  [place] today EXAM: LEFT KNEE - COMPLETE 4+ VIEW COMPARISON:  01/27/2018 FINDINGS: Osseous demineralization. Joint spaces preserved. Subtle cortical deformity at the medial aspect of the LEFT tibial metadiaphysis, less pronounced than on RIGHT, likely benign or old. No acute fracture, dislocation, or bone destruction. No knee joint effusion. IMPRESSION: No acute osseous abnormalities. Electronically Signed   By: Lavonia Dana M.D.   On: 04/18/2018 19:16   Dg Knee Complete 4 Views Right  Result Date: 04/18/2018 CLINICAL DATA:  [place] today EXAM: RIGHT KNEE - COMPLETE 4+ VIEW COMPARISON:  04/02/2018 FINDINGS: Diffuse osseous demineralization. Joint spaces preserved. Chronic deformity of the cortex at the medial aspect of the proximal tibial metadiaphysis, unchanged from previous exam. No interval bony resorption or periosteal reaction to suggest this is an acute finding. No acute fracture, dislocation or bone destruction. No knee joint effusion. IMPRESSION: Osseous demineralization. Chronic cortical deformity at the medial aspect of the proximal tibial metadiaphysis; the lack of interval change since the prior study of 04/02/2018 suggest this is an old rather than evolving acute finding. No definite acute bony abnormalities. If patient has persistent pain referable to the proximal tibia, recommend CT or MR assessment. Electronically Signed   By: Lavonia Dana M.D.   On: 04/18/2018 19:14   Dg Hip Unilat W Or Wo Pelvis 2-3 Views Right  Result Date: 04/18/2018 CLINICAL DATA:  Fall EXAM:  DG HIP (WITH OR WITHOUT PELVIS) 2-3V RIGHT COMPARISON:  04/02/2018, 02/28/2018, on 01/27/2018 FINDINGS: Osseous demineralization. Hip joint spaces preserved. SI joints are  inadequately visualized due to degree of bony demineralization. Fractures of the LEFT superior and inferior pubic rami again identified with significant resorption at the fractures since 01/27/2018. Additional fracture plane laterally at the LEFT superior pubic ramus was not visualized on 01/27/2018 but is present on 04/02/2018. No additional fracture, dislocation or bone destruction. IMPRESSION: Fractures of the LEFT superior and inferior pubic rami as above, unchanged. Electronically Signed   By: Lavonia Dana M.D.   On: 04/18/2018 19:20    Chart has been reviewed    Assessment/Plan  84 y.o. female with medical history significant of ESRD on HD, DM 2, HTN, anemia, hx of rectal cancer, hx of breast ca     Admitted for pain management of pelvic fractures  Present on Admission: . Pelvic fracture ([place]) admit for pain management titrate p.o. medications and for breakthrough use IV morphine given severity of pain will need PT OT evaluation may require placement given repeated falls will need for rehab . DM (diabetes mellitus), type 2 with complications ([facility]) -  - Order Sensitive  SSI   - continue home insulin regimen 30 units,  -  check TSH and HgA1C     . ESRD (end stage renal disease) ([place]) - HD on M,W,F  If still here on Friday will need to notify nephrology that patient has been admitted  . Essential hypertension stable continue home medications  Right foot with pain -will obtain plain imaging to further evaluate  Other plan as per orders.  DVT prophylaxis:  Hep [place]  Code Status:  FULL CODE   as per patient  And family  I had personally discussed CODE STATUS with patient and family     Family Communication:   Family  at  Bedside  plan of care was discussed with grandDaughter,  Disposition Plan:     likely will need placement for rehabilitation                    Would benefit from PT/OT eval prior to DC  Ordered                                       Consults called: none    Admission status:   Obs    Level of care      medical floor         Toy Baker 04/19/2018, 12:37 AM    [facility]  Pager [phone]   after 2 AM please page floor coverage PA If 7AM-7PM, please contact the day team taking care of the patient  [email or web address]  Password [email or web address]

## 2018-04-18 NOTE — ED Triage Notes (Signed)
Pt c/o fall that she had today at HD. Pt has recent hx of pelvis fx from fall that happen a couple of weeks. Pt unable to ambulate. Pt c/o pain in right hip and bilateral knees. Pt denies hitting her head or LOC.

## 2018-04-18 NOTE — ED Provider Notes (Signed)
[facility] EMERGENCY DEPARTMENT Provider Note   CSN: [id number] Arrival date & time: 04/18/18  1750     History   Chief Complaint Chief Complaint  Patient presents with  . Fall    HPI Mary Aguirre is a 84 y.o. female.  HPI Patient had a slip and fall while at hemodialysis today landing on both knees.  She is complaining of bilateral knee pain and right hip pain.  She is had several recent falls and was diagnosed with left inferior and superior pubic rami fractures.  Patient denies any head injury.  No focal weakness or numbness. Past Medical History:  Diagnosis Date  . Diabetes mellitus without complication ([place])     There are no active problems to display for this patient.   History reviewed. No pertinent surgical history.   OB History   None      Home Medications    Prior to Admission medications   Medication Sig Start Date End Date Taking? Authorizing Provider  amLODipine (NORVASC) 5 MG tablet Take 5 mg by mouth daily. 02/15/18  Yes [provider]  atorvastatin (LIPITOR) 40 MG tablet Take 40 mg by mouth at bedtime. 01/23/18  Yes [provider]  bumetanide (BUMEX) 2 MG tablet Take 2 mg by mouth daily. 01/23/18  Yes [provider]  Insulin Detemir (LEVEMIR FLEXTOUCH) 100 UNIT/ML Pen Inject 30 Units into the skin at bedtime. 02/13/18  Yes [provider]  HYDROcodone-acetaminophen (NORCO) 5-325 MG tablet Take 1 tablet by mouth every 6 (six) hours as needed for severe pain. Patient not taking: Reported on 04/18/2018 01/27/18   Sherwood Gambler, MD    Family History No family history on file.  Social History Social History   Tobacco Use  . Smoking status: Never Smoker  . Smokeless tobacco: Never Used  Substance Use Topics  . Alcohol use: Never    Frequency: Never  . Drug use: Never     Allergies   Cinacalcet; Ramipril; and Iohexol   Review of Systems Review of Systems  Constitutional: Negative for  chills and fever.  Respiratory: Negative for cough and shortness of breath.   Cardiovascular: Negative for chest pain.  Gastrointestinal: Negative for abdominal pain, diarrhea, nausea and vomiting.  Genitourinary: Negative for dysuria and flank pain.  Musculoskeletal: Positive for arthralgias. Negative for back pain, myalgias and neck pain.  Skin: Negative for rash and wound.  Neurological: Negative for dizziness, weakness, light-headedness, numbness and headaches.     Physical Exam Updated Vital Signs BP (!) 164/64   Pulse 95   Temp 98.7 F (37.1 C) (Oral)   Resp 15   SpO2 97%   Physical Exam  Constitutional: She is oriented to person, place, and time. She appears well-developed and well-nourished. No distress.  HENT:  Head: Normocephalic and atraumatic.  Mouth/Throat: Oropharynx is clear and moist. No oropharyngeal exudate.  Eyes: Pupils are equal, round, and reactive to light. EOM are normal.  Neck: Normal range of motion. Neck supple. No JVD present.  Cardiovascular: Normal rate and regular rhythm.  Pulmonary/Chest: Effort normal and breath sounds normal.  Abdominal: Soft. Bowel sounds are normal. There is no tenderness. There is no rebound and no guarding.  Musculoskeletal: She exhibits tenderness. She exhibits no edema.  Patient with diminished range of motion of bilateral knees and and right hip due to pain.  Generally tender to palpation of her bilateral knees without obvious deformity or effusion.  Distal pulses are intact.  Right leg appears  mildly shortened and externally rotated.  Pelvis is stable.  Neurological: She is alert and oriented to person, place, and time.  Skin: Skin is warm and dry. Capillary refill takes less than 2 seconds. No rash noted. She is not diaphoretic. No erythema.  Psychiatric: She has a normal mood and affect. Her behavior is normal.  Nursing note and vitals reviewed.    ED Treatments / Results  Labs (all labs ordered are listed, but only  abnormal results are displayed) Labs Reviewed  CBC WITH DIFFERENTIAL/PLATELET - Abnormal; Notable for the following components:      Result Value   RBC 3.22 (*)    Hemoglobin 9.0 (*)    HCT 30.2 (*)    MCHC 29.8 (*)    All other components within normal limits  BASIC METABOLIC PANEL - Abnormal; Notable for the following components:   Glucose, Bld 114 (*)    Creatinine, Ser 2.65 (*)    GFR calc non Af Amer 15 (*)    GFR calc Af Amer 18 (*)    All other components within normal limits    EKG None  Radiology Ct Knee Right Wo Contrast  Result Date: 04/18/2018 CLINICAL DATA:  Fall with right-sided knee pain EXAM: CT OF THE right KNEE WITHOUT CONTRAST TECHNIQUE: Multidetector CT imaging of the right knee was performed according to the standard protocol. Multiplanar CT image reconstructions were also generated. COMPARISON:  04/18/2018, 04/02/2018 FINDINGS: Bones/Joint/Cartilage No acute displaced fracture or malalignment. Mild patellofemoral degenerative changes. Mild medial and lateral joint space degenerative change. No significant knee effusion. Small cortical defect proximal metaphysis of the tibia, corresponding to radiographic abnormality. This could be secondary to remote trauma. No associated soft tissue mass. No periostitis. Ligaments Suboptimally assessed by CT. Muscles and Tendons Quadriceps and patellar tendon appear intact. No intramuscular hematoma. Mild muscular atrophy. Soft tissues Vascular calcifications in the popliteal fossa. IMPRESSION: 1. No acute displaced fracture or malalignment 2. Mild arthritis of the knee 3. Small cortical defect proximal metaphysis of the tibia without associated soft tissue mass. This is of uncertain etiology, possibly a small focus of remote trauma. Electronically Signed   By: Donavan Foil M.D.   On: 04/18/2018 23:10   Ct Hip Right Wo Contrast  Result Date: 04/18/2018 CLINICAL DATA:  84 year old post fall while at dialysis today. Right pelvic  pain. EXAM: CT OF THE RIGHT HIP WITHOUT CONTRAST TECHNIQUE: Multidetector CT imaging of the right hip was performed according to the standard protocol. Multiplanar CT image reconstructions were also generated. COMPARISON:  Radiograph earlier this day. Pelvis and right hip radiographs 04/02/2018 FINDINGS: Bones/Joint/Cartilage Acute mildly displaced fracture of the right puboacetabular junction. This does not extend to the acetabular articular cortex. Acute the right femoral head is well seated. Nondisplaced right inferior pubic ramus fracture. Full field of view imaging of the pelvis demonstrates a comminuted left superior pubic ramus fracture and displaced left inferior pubis ramus fracture, seen on 01/27/2018 radiograph. Fracture component at the left puboacetabular junction is likely chronic but technically age indeterminate. Bilateral sacroiliac joint widening appears chronic and may be secondary to renal osteodystrophy. There is pubic symphyseal widening which is also chronic. Diffuse bony under mineralization. Ligaments Suboptimally assessed by CT. Muscles and Tendons No intramuscular hematoma.  Enthesopathy at the greater trochanter. Soft tissues Soft tissue contusion lateral to the right hip. Colonic diverticulosis. Advanced pelvic vascular calcifications. Enteric sutures in the rectosigmoid colon. IMPRESSION: 1. Acute mildly displaced fracture of the right superior pubic ramus at the puboacetabular  junction. Acute nondisplaced right inferior pubic ramus fracture. 2. Subacute/remote left pubic rami fractures, present since at least 01/27/2018 left hip radiograph. 3. Soft tissue contusion lateral to the right hip. 4. Additional chronic findings as described. Electronically Signed   By: Keith Rake M.D.   On: 04/18/2018 23:09   Dg Knee Complete 4 Views Left  Result Date: 04/18/2018 CLINICAL DATA:  [place] today EXAM: LEFT KNEE - COMPLETE 4+ VIEW COMPARISON:  01/27/2018 FINDINGS: Osseous  demineralization. Joint spaces preserved. Subtle cortical deformity at the medial aspect of the LEFT tibial metadiaphysis, less pronounced than on RIGHT, likely benign or old. No acute fracture, dislocation, or bone destruction. No knee joint effusion. IMPRESSION: No acute osseous abnormalities. Electronically Signed   By: Lavonia Dana M.D.   On: 04/18/2018 19:16   Dg Knee Complete 4 Views Right  Result Date: 04/18/2018 CLINICAL DATA:  [place] today EXAM: RIGHT KNEE - COMPLETE 4+ VIEW COMPARISON:  04/02/2018 FINDINGS: Diffuse osseous demineralization. Joint spaces preserved. Chronic deformity of the cortex at the medial aspect of the proximal tibial metadiaphysis, unchanged from previous exam. No interval bony resorption or periosteal reaction to suggest this is an acute finding. No acute fracture, dislocation or bone destruction. No knee joint effusion. IMPRESSION: Osseous demineralization. Chronic cortical deformity at the medial aspect of the proximal tibial metadiaphysis; the lack of interval change since the prior study of 04/02/2018 suggest this is an old rather than evolving acute finding. No definite acute bony abnormalities. If patient has persistent pain referable to the proximal tibia, recommend CT or MR assessment. Electronically Signed   By: Lavonia Dana M.D.   On: 04/18/2018 19:14   Dg Hip Unilat W Or Wo Pelvis 2-3 Views Right  Result Date: 04/18/2018 CLINICAL DATA:  Fall EXAM: DG HIP (WITH OR WITHOUT PELVIS) 2-3V RIGHT COMPARISON:  04/02/2018, 02/28/2018, on 01/27/2018 FINDINGS: Osseous demineralization. Hip joint spaces preserved. SI joints are inadequately visualized due to degree of bony demineralization. Fractures of the LEFT superior and inferior pubic rami again identified with significant resorption at the fractures since 01/27/2018. Additional fracture plane laterally at the LEFT superior pubic ramus was not visualized on 01/27/2018 but is present on 04/02/2018. No additional fracture,  dislocation or bone destruction. IMPRESSION: Fractures of the LEFT superior and inferior pubic rami as above, unchanged. Electronically Signed   By: Lavonia Dana M.D.   On: 04/18/2018 19:20    Procedures Procedures (including critical care time)  Medications Ordered in ED Medications  fentaNYL (SUBLIMAZE) injection 50 mcg (50 mcg Intravenous Given 04/18/18 2014)  fentaNYL (SUBLIMAZE) injection 50 mcg (50 mcg Intravenous Given 04/18/18 2257)     Initial Impression / Assessment and Plan / ED Course  I have reviewed the triage vital signs and the nursing notes.  Pertinent labs & imaging results that were available during my care of the patient were reviewed by me and considered in my medical decision making (see chart for details).     CT with evidence of superior and inferior pubic rami fracture.  Patient given multiple doses of pain medication and still unable to ambulate.  Discussed with hospitalist regarding admission for likely rehab placement.  Final Clinical Impressions(s) / ED Diagnoses   Final diagnoses:  Fall, initial encounter  Closed fracture of superior ramus of right pubis, initial encounter ([place])  Closed fracture of right inferior pubic ramus, initial encounter [facility])    ED Discharge Orders    None       Julianne Rice, MD 04/18/18 2330

## 2018-04-18 NOTE — ED Notes (Signed)
Patient transported to CT 

## 2018-04-18 NOTE — ED Notes (Signed)
IV access attempted without success, [place], RN to attempt.

## 2018-04-18 NOTE — ED Notes (Signed)
Family at bedside. 

## 2018-04-19 ENCOUNTER — Encounter (HOSPITAL_COMMUNITY): Payer: Self-pay | Admitting: Internal Medicine

## 2018-04-19 ENCOUNTER — Observation Stay (HOSPITAL_COMMUNITY): Payer: Medicare Other

## 2018-04-19 DIAGNOSIS — E118 Type 2 diabetes mellitus with unspecified complications: Secondary | ICD-10-CM | POA: Diagnosis not present

## 2018-04-19 DIAGNOSIS — I10 Essential (primary) hypertension: Secondary | ICD-10-CM | POA: Diagnosis present

## 2018-04-19 DIAGNOSIS — N186 End stage renal disease: Secondary | ICD-10-CM | POA: Diagnosis not present

## 2018-04-19 DIAGNOSIS — S32591A Other specified fracture of right pubis, initial encounter for closed fracture: Secondary | ICD-10-CM | POA: Diagnosis not present

## 2018-04-19 LAB — COMPREHENSIVE METABOLIC PANEL
ALBUMIN: 3 g/dL — AB (ref 3.5–5.0)
ALT: 15 U/L (ref 0–44)
AST: 26 U/L (ref 15–41)
Alkaline Phosphatase: 248 U/L — ABNORMAL HIGH (ref 38–126)
Anion gap: 10 (ref 5–15)
BUN: 18 mg/dL (ref 8–23)
CO2: 25 mmol/L (ref 22–32)
Calcium: 9.4 mg/dL (ref 8.9–10.3)
Chloride: 103 mmol/L (ref 98–111)
Creatinine, Ser: 3.15 mg/dL — ABNORMAL HIGH (ref 0.44–1.00)
GFR calc Af Amer: 14 mL/min — ABNORMAL LOW (ref >60)
GFR calc non Af Amer: 12 mL/min — ABNORMAL LOW (ref >60)
Glucose, Bld: 190 mg/dL — ABNORMAL HIGH (ref 70–99)
Potassium: 3.5 mmol/L (ref 3.5–5.1)
Sodium: 138 mmol/L (ref 135–145)
Total Bilirubin: 0.6 mg/dL (ref 0.3–1.2)
Total Protein: 6 g/dL — ABNORMAL LOW (ref 6.5–8.1)

## 2018-04-19 LAB — PHOSPHORUS: PHOSPHORUS: 5.2 mg/dL — AB (ref 2.5–4.6)

## 2018-04-19 LAB — GLUCOSE, CAPILLARY
GLUCOSE-CAPILLARY: 102 mg/dL — AB (ref 70–99)
GLUCOSE-CAPILLARY: 181 mg/dL — AB (ref 70–99)
Glucose-Capillary: 66 mg/dL — ABNORMAL LOW (ref 70–99)
Glucose-Capillary: 159 mg/dL — ABNORMAL HIGH (ref 70–99)
Glucose-Capillary: 108 mg/dL — ABNORMAL HIGH (ref 70–99)
Glucose-Capillary: 114 mg/dL — ABNORMAL HIGH (ref 70–99)

## 2018-04-19 LAB — HEMOGLOBIN A1C
Hgb A1c MFr Bld: 5.6 % (ref 4.8–5.6)
Mean Plasma Glucose: 114.02 mg/dL

## 2018-04-19 LAB — CBC
HEMATOCRIT: 26.3 % — AB (ref 36.0–46.0)
Hemoglobin: 8.4 g/dL — ABNORMAL LOW (ref 12.0–15.0)
MCH: 29 pg (ref 26.0–34.0)
MCHC: 31.9 g/dL (ref 30.0–36.0)
MCV: 90.7 fL (ref 80.0–100.0)
Platelets: 355 10*3/uL (ref 150–400)
RBC: 2.9 MIL/uL — ABNORMAL LOW (ref 3.87–5.11)
RDW: 14.4 % (ref 11.5–15.5)
WBC: 8.8 10*3/uL (ref 4.0–10.5)
nRBC: 0 % (ref 0.0–0.2)

## 2018-04-19 LAB — TSH: TSH: 1.225 u[IU]/mL (ref 0.350–4.500)

## 2018-04-19 LAB — MAGNESIUM: Magnesium: 1.8 mg/dL (ref 1.7–2.4)

## 2018-04-19 MED ORDER — MORPHINE SULFATE (PF) 2 MG/ML IV SOLN
2.0000 mg | INTRAVENOUS | Status: DC | PRN
  Filled 2018-04-19: qty 1

## 2018-04-19 MED ORDER — ONDANSETRON HCL 4 MG/2ML IJ SOLN: 4.0000 mg | Freq: Four times a day (QID) | INTRAMUSCULAR | Status: DC | PRN

## 2018-04-19 MED ORDER — ACETAMINOPHEN 325 MG PO TABS
650.0000 mg | ORAL_TABLET | Freq: Four times a day (QID) | ORAL | Status: DC | PRN
  Administered 2018-04-19: 650 mg via ORAL
  Filled 2018-04-19: qty 2

## 2018-04-19 MED ORDER — HYDROCODONE-ACETAMINOPHEN 5-325 MG PO TABS
1.0000 | ORAL_TABLET | ORAL | Status: DC | PRN
  Administered 2018-04-19 – 2018-04-21 (×3): 1 via ORAL
  Filled 2018-04-19 (×3): qty 1

## 2018-04-19 MED ORDER — INSULIN ASPART 100 UNIT/ML ~~LOC~~ SOLN
0.0000 [IU] | Freq: Three times a day (TID) | SUBCUTANEOUS | Status: DC
  Administered 2018-04-20: 1 [IU] via SUBCUTANEOUS
  Administered 2018-04-21: 2 [IU] via SUBCUTANEOUS

## 2018-04-19 MED ORDER — ONDANSETRON HCL 4 MG PO TABS
4.0000 mg | ORAL_TABLET | Freq: Four times a day (QID) | ORAL | Status: DC | PRN
  Administered 2018-04-21: 4 mg via ORAL
  Filled 2018-04-19: qty 1

## 2018-04-19 MED ORDER — QUETIAPINE FUMARATE 25 MG PO TABS
25.0000 mg | ORAL_TABLET | Freq: Every evening | ORAL | Status: DC | PRN
  Filled 2018-04-19: qty 1

## 2018-04-19 MED ORDER — HEPARIN SODIUM (PORCINE) 5000 UNIT/ML IJ SOLN
5000.0000 [IU] | Freq: Three times a day (TID) | INTRAMUSCULAR | Status: DC
  Administered 2018-04-19 – 2018-04-22 (×10): 5000 [IU] via SUBCUTANEOUS
  Filled 2018-04-19 (×11): qty 1

## 2018-04-19 MED ORDER — ATORVASTATIN CALCIUM 40 MG PO TABS
40.0000 mg | ORAL_TABLET | Freq: Every day | ORAL | Status: DC
  Administered 2018-04-19 – 2018-04-22 (×5): 40 mg via ORAL
  Filled 2018-04-19 (×5): qty 1

## 2018-04-19 MED ORDER — ACETAMINOPHEN 650 MG RE SUPP: 650.0000 mg | Freq: Four times a day (QID) | RECTAL | Status: DC | PRN

## 2018-04-19 MED ORDER — TRAMADOL-ACETAMINOPHEN 37.5-325 MG PO TABS
1.0000 | ORAL_TABLET | Freq: Four times a day (QID) | ORAL | Status: DC | PRN
  Administered 2018-04-21 – 2018-04-23 (×2): 1 via ORAL
  Administered 2018-04-23: 2 via ORAL
  Filled 2018-04-19: qty 2
  Filled 2018-04-19 (×4): qty 1

## 2018-04-19 MED ORDER — TRAMADOL HCL 50 MG PO TABS
50.0000 mg | ORAL_TABLET | Freq: Four times a day (QID) | ORAL | Status: DC | PRN
  Administered 2018-04-19: 50 mg via ORAL
  Filled 2018-04-19: qty 1

## 2018-04-19 MED ORDER — INSULIN ASPART 100 UNIT/ML ~~LOC~~ SOLN: 0.0000 [IU] | Freq: Every day | SUBCUTANEOUS | Status: DC

## 2018-04-19 MED ORDER — HALOPERIDOL LACTATE 5 MG/ML IJ SOLN: 1.0000 mg | Freq: Four times a day (QID) | INTRAMUSCULAR | Status: DC | PRN

## 2018-04-19 MED ORDER — BUMETANIDE 2 MG PO TABS
2.0000 mg | ORAL_TABLET | Freq: Every day | ORAL | Status: DC
  Administered 2018-04-19 – 2018-04-23 (×4): 2 mg via ORAL
  Filled 2018-04-19 (×5): qty 1

## 2018-04-19 MED ORDER — HYDRALAZINE HCL 20 MG/ML IJ SOLN: 5.0000 mg | INTRAMUSCULAR | Status: DC | PRN

## 2018-04-19 MED ORDER — INSULIN DETEMIR 100 UNIT/ML ~~LOC~~ SOLN
30.0000 [IU] | Freq: Every day | SUBCUTANEOUS | Status: DC
  Administered 2018-04-19 – 2018-04-20 (×2): 30 [IU] via SUBCUTANEOUS
  Filled 2018-04-19 (×3): qty 0.3

## 2018-04-19 MED ORDER — AMLODIPINE BESYLATE 5 MG PO TABS
5.0000 mg | ORAL_TABLET | Freq: Every day | ORAL | Status: DC
  Administered 2018-04-19 – 2018-04-23 (×4): 5 mg via ORAL
  Filled 2018-04-19 (×4): qty 1

## 2018-04-19 NOTE — Evaluation (Signed)
Occupational Therapy Evaluation Patient Details Name: Mary Aguirre MRN: [id number] DOB: 1927-05-22 Today's Date: 04/19/2018    History of Present Illness Patient is a 84 y/o female who presents s/p fall at HD. Pt with recent subacute left infer/superior pubic rami fx and now with new right sided sup/inf pubic rami fxs. PMh includes DM.   Clinical Impression   Pt admitted with above. She demonstrates the below listed deficits and will benefit from continued OT to maximize safety and independence with BADLs.  Pt requires mod A for LB ADLs and min guard assist for functional mobility short distances.  PTA, she was living with daughters and required assist for ADLs since her last fall.   Daughters are unable to provide 24 hour assist, and pt has had recurrent falls.  Recommend SNF level rehab to allow her to maximize safety and independence with ADLs.       Follow Up Recommendations  SNF    Equipment Recommendations  None recommended by OT    Recommendations for Other Services       Precautions / Restrictions Precautions Precautions: Fall Restrictions Weight Bearing Restrictions: No      Mobility Bed Mobility Overal bed mobility: Needs Assistance Bed Mobility: Supine to Sit     Supine to sit: Supervision;HOB elevated     General bed mobility comments: No assist needed, increased time and use of rail.   Transfers Overall transfer level: Needs assistance Equipment used: Rolling walker (2 wheeled) Transfers: Sit to/from Stand Sit to Stand: Min guard         General transfer comment: Min guard to stand from EOB with pt pulling up on RW. "stay away from me now." Transferred to chair post ambulation.    Balance Overall balance assessment: Needs assistance;History of Falls Sitting-balance support: Feet supported;No upper extremity supported Sitting balance-Leahy Scale: Fair     Standing balance support: During functional activity;Bilateral upper extremity  supported Standing balance-Leahy Scale: Poor Standing balance comment: Reliant on BUEs for support in standing.                            ADL either performed or assessed with clinical judgement   ADL Overall ADL's : Needs assistance/impaired Eating/Feeding: Independent   Grooming: Wash/dry hands;Wash/dry face;Oral care;Brushing hair;Min guard;Standing   Upper Body Bathing: Set up;Sitting   Lower Body Bathing: Moderate assistance;Sit to/from stand   Upper Body Dressing : Set up;Sitting   Lower Body Dressing: Moderate assistance;Sit to/from stand   Toilet Transfer: Min guard;Ambulation;Comfort height toilet;RW;Grab bars;BSC   Toileting- Clothing Manipulation and Hygiene: Moderate assistance;Sit to/from stand       Functional mobility during ADLs: Min guard;Rolling walker General ADL Comments: difficulty accessing feet      Vision         Perception     Praxis      Pertinent Vitals/Pain Pain Assessment: Faces Faces Pain Scale: No hurt     Hand Dominance Right   Extremity/Trunk Assessment Upper Extremity Assessment Upper Extremity Assessment: Overall WFL for tasks assessed   Lower Extremity Assessment Lower Extremity Assessment: Defer to PT evaluation   Cervical / Trunk Assessment Cervical / Trunk Assessment: Kyphotic   Communication Communication Communication: No difficulties   Cognition Arousal/Alertness: Awake/alert Behavior During Therapy: WFL for tasks assessed/performed Overall Cognitive Status: History of cognitive impairments - at baseline Area of Impairment: Safety/judgement  Memory: Decreased short-term memory   Safety/Judgement: Decreased awareness of safety     General Comments: Does not remember taking pain meds earlier in AM.    General Comments  pt's daughters present     Exercises     Shoulder Instructions      Home Living Family/patient expects to be discharged to:: Skilled nursing  facility Living Arrangements: Children Available Help at Discharge: Family;Available PRN/intermittently                         Home Equipment: Walker - 2 wheels;Walker - 4 wheels          Prior Functioning/Environment Level of Independence: Needs assistance  Gait / Transfers Assistance Needed: Uses RW for ambulation at home. Per chart, multiple falls reported.  ADL's / Homemaking Assistance Needed: daughter assisted with LB ADLs since last fall             OT Problem List: Decreased strength;Decreased activity tolerance;Impaired balance (sitting and/or standing);Decreased safety awareness;Decreased cognition;Decreased knowledge of use of DME or AE      OT Treatment/Interventions: Self-care/ADL training;DME and/or AE instruction;Therapeutic activities;Cognitive remediation/compensation;Patient/family education;Balance training    OT Goals(Current goals can be found in the care plan section) Acute Rehab OT Goals Patient Stated Goal: to do things for myself  OT Goal Formulation: With patient/family Time For Goal Achievement: 05/03/18 Potential to Achieve Goals: Good ADL Goals Pt Will Perform Grooming: with supervision;standing Pt Will Perform Lower Body Bathing: with supervision;sit to/from stand Pt Will Perform Lower Body Dressing: with supervision;sit to/from stand Pt Will Transfer to Toilet: with supervision;ambulating;bedside commode;grab bars Pt Will Perform Toileting - Clothing Manipulation and hygiene: with supervision;sit to/from stand  OT Frequency: Min 2X/week   Barriers to D/C: Decreased caregiver support  family unable to provide 24 hour assist        Co-evaluation PT/OT/SLP Co-Evaluation/Treatment: Yes Reason for Co-Treatment: For patient/therapist safety;To address functional/ADL transfers   OT goals addressed during session: ADL's and self-care      AM-PAC OT "6 Clicks" Daily Activity     Outcome Measure Help from another person eating  meals?: None Help from another person taking care of personal grooming?: A Little Help from another person toileting, which includes using toliet, bedpan, or urinal?: A Little Help from another person bathing (including washing, rinsing, drying)?: A Lot Help from another person to put on and taking off regular upper body clothing?: None Help from another person to put on and taking off regular lower body clothing?: A Lot 6 Click Score: 18   End of Session Equipment Utilized During Treatment: Rolling walker Nurse Communication: Mobility status  Activity Tolerance: Patient tolerated treatment well Patient left: in chair;with call bell/phone within reach;with chair alarm set;with family/visitor present  OT Visit Diagnosis: Unsteadiness on feet (R26.81)                Time: 8768-1157 OT Time Calculation (min): 19 min Charges:  OT General Charges $OT Visit: 1 Visit OT Evaluation $OT Eval Moderate Complexity: 1 Mod  Lucille Passy, OTR/L Acute Rehabilitation Services Pager [phone] Office [phone]   Lucille Passy M 04/19/2018, 3:19 PM

## 2018-04-19 NOTE — Evaluation (Signed)
Physical Therapy Evaluation Patient Details Name: Mary Aguirre MRN: [id number] DOB: Oct 07, 1926 Today's Date: 04/19/2018   History of Present Illness  Patient is a 84 y/o female who presents s/p fall at HD. Pt with recent subacute left infer/superior pubic rami fx and now with new right sided sup/inf pubic rami fxs. PMh includes DM.  Clinical Impression  Patient presents with generalized weakness, poor safety awareness, impaired balance and impaired mobility s/p above. Tolerated transfers and short distance ambulation with close Min guard for safety. "Stay away from me now." Pt home alone during the day and has had multiple falls. Continues to be a high fall risk. Pt given pain medication earlier in AM so able to tolerance a little bit of activity. Does not have 24/7 supervision and not safe to be home alone. Would benefit from SNF to maximize independence and mobility prior to return home. Will follow.    Follow Up Recommendations SNF    Equipment Recommendations  None recommended by PT    Recommendations for Other Services       Precautions / Restrictions Precautions Precautions: Fall Restrictions Weight Bearing Restrictions: No      Mobility  Bed Mobility Overal bed mobility: Needs Assistance Bed Mobility: Supine to Sit     Supine to sit: Supervision;HOB elevated     General bed mobility comments: No assist needed, increased time and use of rail.   Transfers Overall transfer level: Needs assistance Equipment used: Rolling walker (2 wheeled) Transfers: Sit to/from Stand Sit to Stand: Min guard         General transfer comment: Min guard to stand from EOB with pt pulling up on RW. "stay away from me now." Transferred to chair post ambulation.  Ambulation/Gait Ambulation/Gait assistance: Min guard Gait Distance (Feet): 20 Feet Assistive device: Rolling walker (2 wheeled) Gait Pattern/deviations: Step-through pattern;Trunk flexed;Decreased stride length;Decreased  step length - right;Decreased step length - left Gait velocity: decreased Gait velocity interpretation: <1.8 ft/sec, indicate of risk for recurrent falls General Gait Details: Slow, guarded gait with use of RW for support. Short small steps. Pt limited distance.   Stairs            Wheelchair Mobility    Modified Rankin (Stroke Patients Only)       Balance Overall balance assessment: Needs assistance;History of Falls Sitting-balance support: Feet supported;No upper extremity supported Sitting balance-Leahy Scale: Fair     Standing balance support: During functional activity;Bilateral upper extremity supported Standing balance-Leahy Scale: Poor Standing balance comment: Reliant on BUEs for support in standing.                              Pertinent Vitals/Pain Pain Assessment: Faces Faces Pain Scale: No hurt    Home Living Family/patient expects to be discharged to:: Skilled nursing facility Living Arrangements: Children Available Help at Discharge: Family;Available PRN/intermittently           Home Equipment: Walker - 2 wheels;Walker - 4 wheels      Prior Function     Gait / Transfers Assistance Needed: Uses RW for ambulation at home. Per chart, multiple falls reported.            Hand Dominance        Extremity/Trunk Assessment   Upper Extremity Assessment Upper Extremity Assessment: Defer to OT evaluation    Lower Extremity Assessment Lower Extremity Assessment: Generalized weakness    Cervical / Trunk Assessment Cervical / Trunk  Assessment: Kyphotic  Communication      Cognition Arousal/Alertness: Awake/alert Behavior During Therapy: WFL for tasks assessed/performed Overall Cognitive Status: History of cognitive impairments - at baseline Area of Impairment: Safety/judgement                     Memory: Decreased short-term memory   Safety/Judgement: Decreased awareness of safety     General Comments: Does not  remember taking pain meds earlier in AM.       General Comments General comments (skin integrity, edema, etc.): 2 daughters present during session.    Exercises     Assessment/Plan    PT Assessment Patient needs continued PT services  PT Problem List Decreased strength;Decreased mobility;Decreased safety awareness;Decreased balance;Decreased cognition       PT Treatment Interventions Functional mobility training;Balance training;Patient/family education;Gait training;Therapeutic activities;Stair training;Therapeutic exercise;DME instruction    PT Goals (Current goals can be found in the Care Plan section)  Acute Rehab PT Goals Patient Stated Goal: per daughters- to go to rehab PT Goal Formulation: With family Time For Goal Achievement: 05/03/18 Potential to Achieve Goals: Good    Frequency Min 2X/week   Barriers to discharge Decreased caregiver support      Co-evaluation               AM-PAC PT "6 Clicks" Mobility  Outcome Measure Help needed turning from your back to your side while in a flat bed without using bedrails?: A Little Help needed moving from lying on your back to sitting on the side of a flat bed without using bedrails?: A Little Help needed moving to and from a bed to a chair (including a wheelchair)?: A Little Help needed standing up from a chair using your arms (e.g., wheelchair or bedside chair)?: A Little Help needed to walk in hospital room?: A Little Help needed climbing 3-5 steps with a railing? : A Lot 6 Click Score: 17    End of Session Equipment Utilized During Treatment: Gait belt Activity Tolerance: Patient tolerated treatment well Patient left: in chair;with call bell/phone within reach;with chair alarm set;with family/visitor present Nurse Communication: Mobility status PT Visit Diagnosis: Muscle weakness (generalized) (M62.81);Difficulty in walking, not elsewhere classified (R26.2);Unsteadiness on feet (R26.81)    Time:  2878-6767 PT Time Calculation (min) (ACUTE ONLY): 18 min   Charges:   PT Evaluation $PT Eval Moderate Complexity: 1 Mod          Wray Kearns, PT, DPT Acute Rehabilitation Services Pager [phone] Office [place] 04/19/2018, 12:19 PM

## 2018-04-19 NOTE — Plan of Care (Signed)

## 2018-04-19 NOTE — Progress Notes (Signed)
[facility] PROGRESS NOTE    Progress Note  Mary Aguirre  HBZ:[id number] DOB: 10/08/1926 DOA: 04/18/2018 PCP: Drake Leach, MD     Brief Narrative:   Mary Aguirre is an 84 y.o. female past medical history of end-stage renal disease on hemodialysis, diabetes mellitus type 2 rectal cancer and history of breast cancerpast medical history of incision renal disease on hemodialysis, diabetes mellitus type 2 rectal cancer and history of basal cancer today after a mechanical fall presents today after mechanical fall, patient relates she slipped, the patient relates she slipped.  Assessment/Plan:   Pelvic fracture [facility]): Continue pain control with IV morphine. Physical therapy and OT evaluation are pending. Patient might require skilled nursing facility placement. We will try to use oral painPatient might require skilled nursingAs much as possible.  Pain control as much as possible.facility placement. We'll try to use oral pain controlling medication. Patient is medically stable for transfer awaiting skilled nursing facility placement and physical therapy evaluation.  End-stage renal disease: She dialyzes Monday Wednesday and Friday. She is probably on the holiday scheduleHe is probably on the holiday schedule has been informed renal has been informed  DM (diabetes mellitus), type 2 with complications (HCC) Continue long-acting insulin plus sliding scale. A1c was 5.6.    Essential hypertension Continue current regimen blood pressure mildly high. IV as blood pressure meds as needed   DVT prophylaxis: lovenxo Family Communication:None Disposition Plan/Barrier to D/C: SNF in 1-2 days Code Status:     Code Status Orders  (From admission, onward)         Start     Ordered   04/19/18 0126  Full code  Continuous     04/19/18 0125        Code Status History    This patient has a current code status but no historical code status.        IV Access:    Peripheral  IV   Procedures and diagnostic studies:   Ct Knee Right Wo Contrast  Result Date: 04/18/2018 CLINICAL DATA:  Fall with right-sided knee pain EXAM: CT OF THE right KNEE WITHOUT CONTRAST TECHNIQUE: Multidetector CT imaging of the right knee was performed according to the standard protocol. Multiplanar CT image reconstructions were also generated. COMPARISON:  04/18/2018, 04/02/2018 FINDINGS: Bones/Joint/Cartilage No acute displaced fracture or malalignment. Mild patellofemoral degenerative changes. Mild medial and lateral joint space degenerative change. No significant knee effusion. Small cortical defect proximal metaphysis of the tibia, corresponding to radiographic abnormality. This could be secondary to remote trauma. No associated soft tissue mass. No periostitis. Ligaments Suboptimally assessed by CT. Muscles and Tendons Quadriceps and patellar tendon appear intact. No intramuscular hematoma. Mild muscular atrophy. Soft tissues Vascular calcifications in the popliteal fossa. IMPRESSION: 1. No acute displaced fracture or malalignment 2. Mild arthritis of the knee 3. Small cortical defect proximal metaphysis of the tibia without associated soft tissue mass. This is of uncertain etiology, possibly a small focus of remote trauma. Electronically Signed   By: Donavan Foil M.D.   On: 04/18/2018 23:10   Ct Hip Right Wo Contrast  Result Date: 04/18/2018 CLINICAL DATA:  84 year old post fall while at dialysis today. Right pelvic pain. EXAM: CT OF THE RIGHT HIP WITHOUT CONTRAST TECHNIQUE: Multidetector CT imaging of the right hip was performed according to the standard protocol. Multiplanar CT image reconstructions were also generated. COMPARISON:  Radiograph earlier this day. Pelvis and right hip radiographs 04/02/2018 FINDINGS: Bones/Joint/Cartilage Acute mildly displaced fracture of the right puboacetabular  junction. This does not extend to the acetabular articular cortex. Acute the right femoral head is  well seated. Nondisplaced right inferior pubic ramus fracture. Full field of view imaging of the pelvis demonstrates a comminuted left superior pubic ramus fracture and displaced left inferior pubis ramus fracture, seen on 01/27/2018 radiograph. Fracture component at the left puboacetabular junction is likely chronic but technically age indeterminate. Bilateral sacroiliac joint widening appears chronic and may be secondary to renal osteodystrophy. There is pubic symphyseal widening which is also chronic. Diffuse bony under mineralization. Ligaments Suboptimally assessed by CT. Muscles and Tendons No intramuscular hematoma.  Enthesopathy at the greater trochanter. Soft tissues Soft tissue contusion lateral to the right hip. Colonic diverticulosis. Advanced pelvic vascular calcifications. Enteric sutures in the rectosigmoid colon. IMPRESSION: 1. Acute mildly displaced fracture of the right superior pubic ramus at the puboacetabular junction. Acute nondisplaced right inferior pubic ramus fracture. 2. Subacute/remote left pubic rami fractures, present since at least 01/27/2018 left hip radiograph. 3. Soft tissue contusion lateral to the right hip. 4. Additional chronic findings as described. Electronically Signed   By: Keith Rake M.D.   On: 04/18/2018 23:09   Dg Knee Complete 4 Views Left  Result Date: 04/18/2018 CLINICAL DATA:  [place] today EXAM: LEFT KNEE - COMPLETE 4+ VIEW COMPARISON:  01/27/2018 FINDINGS: Osseous demineralization. Joint spaces preserved. Subtle cortical deformity at the medial aspect of the LEFT tibial metadiaphysis, less pronounced than on RIGHT, likely benign or old. No acute fracture, dislocation, or bone destruction. No knee joint effusion. IMPRESSION: No acute osseous abnormalities. Electronically Signed   By: Lavonia Dana M.D.   On: 04/18/2018 19:16   Dg Knee Complete 4 Views Right  Result Date: 04/18/2018 CLINICAL DATA:  [place] today EXAM: RIGHT KNEE - COMPLETE 4+ VIEW COMPARISON:   04/02/2018 FINDINGS: Diffuse osseous demineralization. Joint spaces preserved. Chronic deformity of the cortex at the medial aspect of the proximal tibial metadiaphysis, unchanged from previous exam. No interval bony resorption or periosteal reaction to suggest this is an acute finding. No acute fracture, dislocation or bone destruction. No knee joint effusion. IMPRESSION: Osseous demineralization. Chronic cortical deformity at the medial aspect of the proximal tibial metadiaphysis; the lack of interval change since the prior study of 04/02/2018 suggest this is an old rather than evolving acute finding. No definite acute bony abnormalities. If patient has persistent pain referable to the proximal tibia, recommend CT or MR assessment. Electronically Signed   By: Lavonia Dana M.D.   On: 04/18/2018 19:14   Dg Foot 2 Views Right  Result Date: 04/19/2018 CLINICAL DATA:  Right foot pain after fall. EXAM: RIGHT FOOT - 2 VIEW COMPARISON:  Radiographs 04/02/2018 FINDINGS: Hammertoe deformity of the digits. The bones are under mineralized. No acute fracture or dislocation. Probable os peroneal. No focal soft tissue abnormality. Vascular calcifications. IMPRESSION: No acute fracture or dislocation of the right foot. Electronically Signed   By: Keith Rake M.D.   On: 04/19/2018 00:55   Dg Hip Unilat W Or Wo Pelvis 2-3 Views Right  Result Date: 04/18/2018 CLINICAL DATA:  Fall EXAM: DG HIP (WITH OR WITHOUT PELVIS) 2-3V RIGHT COMPARISON:  04/02/2018, 02/28/2018, on 01/27/2018 FINDINGS: Osseous demineralization. Hip joint spaces preserved. SI joints are inadequately visualized due to degree of bony demineralization. Fractures of the LEFT superior and inferior pubic rami again identified with significant resorption at the fractures since 01/27/2018. Additional fracture plane laterally at the LEFT superior pubic ramus was not visualized on 01/27/2018 but is present on 04/02/2018.  No additional fracture, dislocation or  bone destruction. IMPRESSION: Fractures of the LEFT superior and inferior pubic rami as above, unchanged. Electronically Signed   By: Lavonia Dana M.D.   On: 04/18/2018 19:20     Medical Consultants:    None.  Anti-Infectives:   None  Subjective:    Mary Aguirre she relates her pain is tolerable used pain medication after she moves around.  Objective:    Vitals:   04/19/18 0030 04/19/18 0132 04/19/18 0140 04/19/18 0505  BP: (!) 170/73 (!) 174/60  (!) 160/56  Pulse: (!) 117 99  (!) 104  Resp:  14  14  Temp:  98.6 F (37 C)  98 F (36.7 C)  TempSrc:  Oral  Oral  SpO2: 98% 98%  99%  Weight:   49.6 kg   Height:   5\' 2"  (1.575 m)    No intake or output data in the 24 hours ending 04/19/18 1058 Filed Weights   04/19/18 0140  Weight: 49.6 kg    Exam: General exam: In no acute distress. Respiratory system: Good air movement and clear to auscultation. Cardiovascular system: S1 & S2 heard, RRR.   Gastrointestinal system: Abdomen is nondistended, soft and nontender.  Central nervous system: Alert and oriented. No focal neurological deficits.limited by pain Extremities: No pedal edema. Skin: No rashes, lesions or ulcers Psychiatry: Judgement and insight appear normal. Mood & affect appropriate.    Data Reviewed:    Labs: Basic Metabolic Panel: Recent Labs  Lab 04/18/18 2007 04/19/18 0241  NA 139 138  K 3.6 3.5  CL 103 103  CO2 26 25  GLUCOSE 114* 190*  BUN 14 18  CREATININE 2.65* 3.15*  CALCIUM 9.7 9.4  MG  --  1.8  PHOS  --  5.2*   GFR Estimated Creatinine Clearance: 9.5 mL/min (A) (by C-G formula based on SCr of 3.15 mg/dL (H)). Liver Function Tests: Recent Labs  Lab 04/19/18 0241  AST 26  ALT 15  ALKPHOS 248*  BILITOT 0.6  PROT 6.0*  ALBUMIN 3.0*   No results for input(s): LIPASE, AMYLASE in the last 168 hours. No results for input(s): AMMONIA in the last 168 hours. Coagulation profile No results for input(s): INR, PROTIME in the last 168  hours.  CBC: Recent Labs  Lab 04/18/18 2007 04/19/18 0241  WBC 9.4 8.8  NEUTROABS 6.6  --   HGB 9.0* 8.4*  HCT 30.2* 26.3*  MCV 93.8 90.7  PLT 383 355   Cardiac Enzymes: No results for input(s): CKTOTAL, CKMB, CKMBINDEX, TROPONINI in the last 168 hours. BNP (last 3 results) No results for input(s): PROBNP in the last 8760 hours. CBG: Recent Labs  Lab 04/19/18 0159 04/19/18 0237 04/19/18 0639  GLUCAP 66* 159* 108*   D-Dimer: No results for input(s): DDIMER in the last 72 hours. Hgb A1c: Recent Labs    04/19/18 0241  HGBA1C 5.6   Lipid Profile: No results for input(s): CHOL, HDL, LDLCALC, TRIG, CHOLHDL, LDLDIRECT in the last 72 hours. Thyroid function studies: Recent Labs    04/19/18 0241  TSH 1.225   Anemia work up: No results for input(s): VITAMINB12, FOLATE, FERRITIN, TIBC, IRON, RETICCTPCT in the last 72 hours. Sepsis Labs: Recent Labs  Lab 04/18/18 2007 04/19/18 0241  WBC 9.4 8.8   Microbiology No results found for this or any previous visit (from the past 240 hour(s)).   Medications:   . amLODipine  5 mg Oral Daily  . atorvastatin  40 mg Oral  QHS  . bumetanide  2 mg Oral Daily  . heparin  5,000 Units Subcutaneous Q8H  . insulin aspart  0-5 Units Subcutaneous QHS  . insulin aspart  0-9 Units Subcutaneous TID WC  . insulin detemir  30 Units Subcutaneous QHS   Continuous Infusions:     LOS: 0 days   Charlynne Cousins  [facility]   *Please refer to [email or web address], password [email or web address] to get updated schedule on who will round on this patient, as hospitalists switch teams weekly. If 7PM-7AM, please contact night-coverage at [email or web address], password [email or web address] for any overnight needs.  04/19/2018, 10:58 AM

## 2018-04-20 DIAGNOSIS — S32591A Other specified fracture of right pubis, initial encounter for closed fracture: Secondary | ICD-10-CM | POA: Diagnosis not present

## 2018-04-20 LAB — GLUCOSE, CAPILLARY
GLUCOSE-CAPILLARY: 119 mg/dL — AB (ref 70–99)
Glucose-Capillary: 73 mg/dL (ref 70–99)
Glucose-Capillary: 125 mg/dL — ABNORMAL HIGH (ref 70–99)
Glucose-Capillary: 75 mg/dL (ref 70–99)

## 2018-04-20 MED ORDER — CHLORHEXIDINE GLUCONATE CLOTH 2 % EX PADS
6.0000 | MEDICATED_PAD | Freq: Every day | CUTANEOUS | Status: DC
  Administered 2018-04-20 – 2018-04-23 (×2): 6 via TOPICAL

## 2018-04-20 NOTE — Consult Note (Signed)
Renal Service Consult Note [facility]  Mary Aguirre 04/20/2018 Mary Aguirre Requesting Physician:  Dr. Broadus John  Reason for Consult:  ESRD pt w/ fall and pelvic fx HPI: The patient is a 84 y.o. year-old w hx of HTN, DM2 on insulin and ESRD, recent start to HD Mar 17, 2018 in [place].  Pt is poor historian, hx obtained from the chart.  Pt fell recently and had L sided pelvic fractures. She now fell again and has new R sided pelvic fractures.  Pt lives at home w/ family.  Pt was admitted 11/27 pm for inability to ambulate and pain control w/ probability of needing SNF placement as well.  Pt recently started dialysis at [facility] center on TTS schedule.  Asked to see for dialysis.    Pt poor historian and reluctant to answer questions.  No active CP, SOB, cough or abd pain.    ROS  denies CP  no joint pain   no HA  no blurry vision  no rash  no diarrhea  no nausea/ vomiting   Past Medical History  Past Medical History:  Diagnosis Date  . Diabetes mellitus without complication [facility])    Past Surgical History History reviewed. No pertinent surgical history. Family History  Family History  Problem Relation Age of Onset  . Colon cancer Brother   . Stroke Neg Hx    Social History  reports that she has never smoked. She has never used smokeless tobacco. She reports that she does not drink alcohol or use drugs. Allergies  Allergies  Allergen Reactions  . Cinacalcet Other (See Comments)  . Ramipril Other (See Comments)    unknown  . Iohexol      Desc: hives;pt pre-medicated with 13 hr prep did fine-02/15/05    Home medications Prior to Admission medications   Medication Sig Start Date End Date Taking? Authorizing Provider  amLODipine (NORVASC) 5 MG tablet Take 5 mg by mouth daily. 02/15/18  Yes [provider]  atorvastatin (LIPITOR) 40 MG tablet Take 40 mg by mouth at bedtime. 01/23/18  Yes [provider]  bumetanide (BUMEX) 2 MG  tablet Take 2 mg by mouth daily. 01/23/18  Yes [provider]  Insulin Detemir (LEVEMIR FLEXTOUCH) 100 UNIT/ML Pen Inject 30 Units into the skin at bedtime. 02/13/18  Yes [provider]  HYDROcodone-acetaminophen (NORCO) 5-325 MG tablet Take 1 tablet by mouth every 6 (six) hours as needed for severe pain. Patient not taking: Reported on 04/18/2018 01/27/18   Sherwood Gambler, MD   Liver Function Tests Recent Labs  Lab 04/19/18 0241  AST 26  ALT 15  ALKPHOS 248*  BILITOT 0.6  PROT 6.0*  ALBUMIN 3.0*   No results for input(s): LIPASE, AMYLASE in the last 168 hours. CBC Recent Labs  Lab 04/18/18 2007 04/19/18 0241  WBC 9.4 8.8  NEUTROABS 6.6  --   HGB 9.0* 8.4*  HCT 30.2* 26.3*  MCV 93.8 90.7  PLT 383 106   Basic Metabolic Panel Recent Labs  Lab 04/18/18 2007 04/19/18 0241  NA 139 138  K 3.6 3.5  CL 103 103  CO2 26 25  GLUCOSE 114* 190*  BUN 14 18  CREATININE 2.65* 3.15*  CALCIUM 9.7 9.4  PHOS  --  5.2*   Iron/TIBC/Ferritin/ %Sat No results found for: IRON, TIBC, FERRITIN, IRONPCTSAT  Vitals:   04/19/18 1343 04/19/18 1940 04/20/18 0548 04/20/18 0933  BP: (!) 141/59 (!) 159/63 (!) 164/65 (!) 158/50  Pulse: 88 90 88 97  Resp: 17 14 14 18   Temp: (!) 97.4 F (36.3 C) 97.7 F (36.5 C) 98 F (36.7 C) 98.8 F (37.1 C)  TempSrc: Oral Oral Oral Oral  SpO2: 98% 100% 97% 99%  Weight:      Height:       Exam Gen chron ill appearing AAF, not in distress No rash, cyanosis or gangrene Sclera anicteric, throat clear  No jvd or bruits Chest clear bilat to bases, no rales or wheezing RRR soft SEM, no RG Abd soft ntnd no mass or ascites +bs GU defer MS no joint effusions or deformity Ext no LE or UE edema, no wounds or ulcers Neuro is alert, Ox 3 , nf R IJ TDC, no perm access    Home meds:  - amlodipine 5 hs/ bumetanide 2mg  qd  - atorvastatin 40 hs  - insulin detemir 30u hs   Dialysis: [facility]   3h  300/600   3K/2.5 bath  Hep  none  RIJ TDC  - hist of cancer not getting esa  - started HD 03/17/18     Impression/ Plan: 1. Recurrent falls/ bilat pelvic fractures - admitted for pain control, SNF placement 2. ESRD on HD - recent start to HD, no perm access, has RIJ TDC. HD Sat  3. HTN - cont norvasc. No vol overload on exam, UF 1-2 L. Forgot to get dry wt from OP unit.  4. DM2 on insulin 5. H/o rectal cancer 6. Anemia ckd - no esa due to h/o cancer. Hb 8.4. Check Fe.  7. MBD ckd - Ca/ phos in range, no binders listed on home meds. No need for binders now.      Kelly Splinter MD [employer] pager [phone]   04/20/2018, 11:40 AM

## 2018-04-20 NOTE — Progress Notes (Signed)
[facility] PROGRESS NOTE    Progress Note  Mary Aguirre  NFA:[id number] DOB: 01/24/1927 DOA: 04/18/2018 PCP: Drake Leach, MD     Brief Narrative:   Mary Aguirre is an 84 y.o. female past medical history of end-stage renal disease on hemodialysis, diabetes mellitus type 2,  rectal cancer and history of breast cancer -History of left-sided pelvic fractures in September following mechanical fall -now with Right sided pelvic fractures sustained following a mechanical fall after hemodialysis on Wednesday  Assessment/Plan:   R Sided pelvic fractures ([place]): -History of similar superior and inferior pubic rami fractures on the left in September -With severe pain -Add low-dose NSAIDs, IV morphine PRN for severe pain -PT/OT evaluation -SNF if possible for rehab -Social work consulted  ESRD on hemodialysis Monday Wednesday Friday -Last HD on Wednesday, -Due for dialysis today -Nephrology consulted -Has a right IJ permacath  Diabetes mellitus type 2 -On Levemir nightly, hemoglobin A1c is only 5.6 -Will decrease dose of Levemir  Hypertension -stable  Anemia of chronic disease -Aranesp with HD  DVT prophylaxis: lovenxo Family Communication:None Disposition Plan/Barrier to D/C: SNF bed available, she is medically stable Code Status:     Code Status Orders  (From admission, onward)         Start     Ordered   04/19/18 0126  Full code  Continuous     04/19/18 0125        Code Status History    This patient has a current code status but no historical code status.        IV Access:    Peripheral IV   Procedures and diagnostic studies:   Ct Knee Right Wo Contrast  Result Date: 04/18/2018 CLINICAL DATA:  Fall with right-sided knee pain EXAM: CT OF THE right KNEE WITHOUT CONTRAST TECHNIQUE: Multidetector CT imaging of the right knee was performed according to the standard protocol. Multiplanar CT image reconstructions were also generated. COMPARISON:   04/18/2018, 04/02/2018 FINDINGS: Bones/Joint/Cartilage No acute displaced fracture or malalignment. Mild patellofemoral degenerative changes. Mild medial and lateral joint space degenerative change. No significant knee effusion. Small cortical defect proximal metaphysis of the tibia, corresponding to radiographic abnormality. This could be secondary to remote trauma. No associated soft tissue mass. No periostitis. Ligaments Suboptimally assessed by CT. Muscles and Tendons Quadriceps and patellar tendon appear intact. No intramuscular hematoma. Mild muscular atrophy. Soft tissues Vascular calcifications in the popliteal fossa. IMPRESSION: 1. No acute displaced fracture or malalignment 2. Mild arthritis of the knee 3. Small cortical defect proximal metaphysis of the tibia without associated soft tissue mass. This is of uncertain etiology, possibly a small focus of remote trauma. Electronically Signed   By: Donavan Foil M.D.   On: 04/18/2018 23:10   Ct Hip Right Wo Contrast  Result Date: 04/18/2018 CLINICAL DATA:  84 year old post fall while at dialysis today. Right pelvic pain. EXAM: CT OF THE RIGHT HIP WITHOUT CONTRAST TECHNIQUE: Multidetector CT imaging of the right hip was performed according to the standard protocol. Multiplanar CT image reconstructions were also generated. COMPARISON:  Radiograph earlier this day. Pelvis and right hip radiographs 04/02/2018 FINDINGS: Bones/Joint/Cartilage Acute mildly displaced fracture of the right puboacetabular junction. This does not extend to the acetabular articular cortex. Acute the right femoral head is well seated. Nondisplaced right inferior pubic ramus fracture. Full field of view imaging of the pelvis demonstrates a comminuted left superior pubic ramus fracture and displaced left inferior pubis ramus fracture, seen on 01/27/2018 radiograph. Fracture component  at the left puboacetabular junction is likely chronic but technically age indeterminate. Bilateral  sacroiliac joint widening appears chronic and may be secondary to renal osteodystrophy. There is pubic symphyseal widening which is also chronic. Diffuse bony under mineralization. Ligaments Suboptimally assessed by CT. Muscles and Tendons No intramuscular hematoma.  Enthesopathy at the greater trochanter. Soft tissues Soft tissue contusion lateral to the right hip. Colonic diverticulosis. Advanced pelvic vascular calcifications. Enteric sutures in the rectosigmoid colon. IMPRESSION: 1. Acute mildly displaced fracture of the right superior pubic ramus at the puboacetabular junction. Acute nondisplaced right inferior pubic ramus fracture. 2. Subacute/remote left pubic rami fractures, present since at least 01/27/2018 left hip radiograph. 3. Soft tissue contusion lateral to the right hip. 4. Additional chronic findings as described. Electronically Signed   By: Keith Rake M.D.   On: 04/18/2018 23:09   Dg Knee Complete 4 Views Left  Result Date: 04/18/2018 CLINICAL DATA:  [place] today EXAM: LEFT KNEE - COMPLETE 4+ VIEW COMPARISON:  01/27/2018 FINDINGS: Osseous demineralization. Joint spaces preserved. Subtle cortical deformity at the medial aspect of the LEFT tibial metadiaphysis, less pronounced than on RIGHT, likely benign or old. No acute fracture, dislocation, or bone destruction. No knee joint effusion. IMPRESSION: No acute osseous abnormalities. Electronically Signed   By: Lavonia Dana M.D.   On: 04/18/2018 19:16   Dg Knee Complete 4 Views Right  Result Date: 04/18/2018 CLINICAL DATA:  [place] today EXAM: RIGHT KNEE - COMPLETE 4+ VIEW COMPARISON:  04/02/2018 FINDINGS: Diffuse osseous demineralization. Joint spaces preserved. Chronic deformity of the cortex at the medial aspect of the proximal tibial metadiaphysis, unchanged from previous exam. No interval bony resorption or periosteal reaction to suggest this is an acute finding. No acute fracture, dislocation or bone destruction. No knee joint effusion.  IMPRESSION: Osseous demineralization. Chronic cortical deformity at the medial aspect of the proximal tibial metadiaphysis; the lack of interval change since the prior study of 04/02/2018 suggest this is an old rather than evolving acute finding. No definite acute bony abnormalities. If patient has persistent pain referable to the proximal tibia, recommend CT or MR assessment. Electronically Signed   By: Lavonia Dana M.D.   On: 04/18/2018 19:14   Dg Foot 2 Views Right  Result Date: 04/19/2018 CLINICAL DATA:  Right foot pain after fall. EXAM: RIGHT FOOT - 2 VIEW COMPARISON:  Radiographs 04/02/2018 FINDINGS: Hammertoe deformity of the digits. The bones are under mineralized. No acute fracture or dislocation. Probable os peroneal. No focal soft tissue abnormality. Vascular calcifications. IMPRESSION: No acute fracture or dislocation of the right foot. Electronically Signed   By: Keith Rake M.D.   On: 04/19/2018 00:55   Dg Hip Unilat W Or Wo Pelvis 2-3 Views Right  Result Date: 04/18/2018 CLINICAL DATA:  Fall EXAM: DG HIP (WITH OR WITHOUT PELVIS) 2-3V RIGHT COMPARISON:  04/02/2018, 02/28/2018, on 01/27/2018 FINDINGS: Osseous demineralization. Hip joint spaces preserved. SI joints are inadequately visualized due to degree of bony demineralization. Fractures of the LEFT superior and inferior pubic rami again identified with significant resorption at the fractures since 01/27/2018. Additional fracture plane laterally at the LEFT superior pubic ramus was not visualized on 01/27/2018 but is present on 04/02/2018. No additional fracture, dislocation or bone destruction. IMPRESSION: Fractures of the LEFT superior and inferior pubic rami as above, unchanged. Electronically Signed   By: Lavonia Dana M.D.   On: 04/18/2018 19:20     Medical Consultants:    None.  Anti-Infectives:   None  Subjective:  Complains of severe right pelvic pain, barely able to move right leg  Objective:    Vitals:    04/19/18 1343 04/19/18 1940 04/20/18 0548 04/20/18 0933  BP: (!) 141/59 (!) 159/63 (!) 164/65 (!) 158/50  Pulse: 88 90 88 97  Resp: 17 14 14 18   Temp: (!) 97.4 F (36.3 C) 97.7 F (36.5 C) 98 F (36.7 C) 98.8 F (37.1 C)  TempSrc: Oral Oral Oral Oral  SpO2: 98% 100% 97% 99%  Weight:      Height:        Intake/Output Summary (Last 24 hours) at 04/20/2018 1218 Last data filed at 04/20/2018 0900 Gross per 24 hour  Intake 720 ml  Output 1 ml  Net 719 ml   Filed Weights   04/19/18 0140  Weight: 49.6 kg    Exam: Gen: Frail, chronically ill-appearing female HEENT: Oral mucosa moist, edentulous, right IJ permacath noted Lungs: Decreased breath sounds at both bases CVS: RRR,No Gallops,Rubs or new Murmurs Abd: soft, Non tender, non distended, BS present Extremities: Significantly limited mobility in the right pelvis/hip Skin: no new rashes Psychiatry: Judgement and insight appear normal. Mood & affect appropriate.    Data Reviewed:    Labs: Basic Metabolic Panel: Recent Labs  Lab 04/18/18 2007 04/19/18 0241  NA 139 138  K 3.6 3.5  CL 103 103  CO2 26 25  GLUCOSE 114* 190*  BUN 14 18  CREATININE 2.65* 3.15*  CALCIUM 9.7 9.4  MG  --  1.8  PHOS  --  5.2*   GFR Estimated Creatinine Clearance: 9.5 mL/min (A) (by C-G formula based on SCr of 3.15 mg/dL (H)). Liver Function Tests: Recent Labs  Lab 04/19/18 0241  AST 26  ALT 15  ALKPHOS 248*  BILITOT 0.6  PROT 6.0*  ALBUMIN 3.0*   No results for input(s): LIPASE, AMYLASE in the last 168 hours. No results for input(s): AMMONIA in the last 168 hours. Coagulation profile No results for input(s): INR, PROTIME in the last 168 hours.  CBC: Recent Labs  Lab 04/18/18 2007 04/19/18 0241  WBC 9.4 8.8  NEUTROABS 6.6  --   HGB 9.0* 8.4*  HCT 30.2* 26.3*  MCV 93.8 90.7  PLT 383 355   Cardiac Enzymes: No results for input(s): CKTOTAL, CKMB, CKMBINDEX, TROPONINI in the last 168 hours. BNP (last 3 results) No  results for input(s): PROBNP in the last 8760 hours. CBG: Recent Labs  Lab 04/19/18 1143 04/19/18 1558 04/19/18 2125 04/20/18 0638 04/20/18 1154  GLUCAP 114* 102* 181* 73 125*   D-Dimer: No results for input(s): DDIMER in the last 72 hours. Hgb A1c: Recent Labs    04/19/18 0241  HGBA1C 5.6   Lipid Profile: No results for input(s): CHOL, HDL, LDLCALC, TRIG, CHOLHDL, LDLDIRECT in the last 72 hours. Thyroid function studies: Recent Labs    04/19/18 0241  TSH 1.225   Anemia work up: No results for input(s): VITAMINB12, FOLATE, FERRITIN, TIBC, IRON, RETICCTPCT in the last 72 hours. Sepsis Labs: Recent Labs  Lab 04/18/18 2007 04/19/18 0241  WBC 9.4 8.8   Microbiology No results found for this or any previous visit (from the past 240 hour(s)).   Medications:   . amLODipine  5 mg Oral Daily  . atorvastatin  40 mg Oral QHS  . bumetanide  2 mg Oral Daily  . Chlorhexidine Gluconate Cloth  6 each Topical Q0600  . heparin  5,000 Units Subcutaneous Q8H  . insulin aspart  0-5 Units Subcutaneous QHS  .  insulin aspart  0-9 Units Subcutaneous TID WC  . insulin detemir  30 Units Subcutaneous QHS   Continuous Infusions:     LOS: 0 days   Domenic Polite  [facility] Page via [email or web address]  04/20/2018, 12:18 PM

## 2018-04-20 NOTE — Clinical Social Work Note (Signed)
Clinical Social Work Assessment  Patient Details  Name: Mary Aguirre MRN: [id number] Date of Birth: 01/11/1927  Date of referral:  04/20/18               Reason for consult:  Discharge Planning                Permission sought to share information with:  Case Manager, Facility Sport and exercise psychologist, Family Supports Permission granted to share information::  Yes, Verbal Permission Granted  Name::     [name]  Agency::  SNF  Relationship::  daughter  Contact Information:  [phone]  Housing/Transportation Living arrangements for the past 2 months:  [place] of Information:  Patient Patient Interpreter Needed:  None Criminal Activity/Legal Involvement Pertinent to Current Situation/Hospitalization:  No - Comment as needed Significant Relationships:  Adult Children Lives with:  Self Do you feel safe going back to the place where you live?  No Need for family participation in patient care:  Yes (Comment)  Care giving concerns:  CSW received referral for possible SNF placement at time of discharge. Spoke with patient regarding possibility of SNF placement . Patient's family   is currently unable to care for her at their home given patient's current needs and fall risk.  Patient and daughter [name] expressed understanding of PT recommendation and are agreeable to SNF placement at time of discharge. CSW to continue to follow and assist with discharge planning needs.     Social Worker assessment / plan:  Spoke with patient and  Daughter [name] concerning possibility of rehab at SNF before returning home.    Employment status:  Retired Nurse, adult PT Recommendations:  [place] / Referral to community resources:  [place]  Patient/Family's Response to care:  Patient and daughter [name] recognize need for rehab before returning home and are agreeable to a SNF in [place]. They report preference  for  [facility] as [facility] choice, [facility]  . CSW explained insurance authorization process. Patient's family reported that they want patient to get stronger to be able to come back home.    Patient/Family's Understanding of and Emotional Response to Diagnosis, Current Treatment, and Prognosis:  Patient/family is realistic regarding therapy needs and expressed being hopeful for SNF placement. Patient expressed understanding of CSW role and discharge process as well as medical condition. No questions/concerns about plan or treatment.    Emotional Assessment Appearance:  Appears stated age Attitude/Demeanor/Rapport:  Unable to Assess Affect (typically observed):  Unable to Assess Orientation:  Oriented to Self, Oriented to  Time, Oriented to Situation, Oriented to Place Alcohol / Substance use:  Not Applicable Psych involvement (Current and /or in the community):  No (Comment)  Discharge Needs  Concerns to be addressed:  Discharge Planning Concerns Readmission within the last 30 days:  No Current discharge risk:  Dependent with Mobility Barriers to Discharge:  Continued Medical Work up   [employer], LCSW 04/20/2018, 11:42 AM

## 2018-04-20 NOTE — Progress Notes (Signed)
PT Cancellation Note  Patient Details Name: Mary Aguirre MRN: [id number] DOB: 08/20/1926   Cancelled Treatment:    Reason Eval/Treat Not Completed: Other (comment). Patient refusing out of bed mobility or therapeutic exercises, stating, "I don't feel like it today," despite education on benefits of mobility/strengthening.  Ellamae Sia, PT, DPT Acute Rehabilitation Services Pager [phone] Office [phone]    Mary Aguirre 04/20/2018, 3:03 PM

## 2018-04-20 NOTE — Plan of Care (Signed)

## 2018-04-20 NOTE — NC FL2 (Signed)
[place] MEDICAID FL2 LEVEL OF CARE SCREENING TOOL     IDENTIFICATION  Patient Name: Mary Aguirre Birthdate: [birth year] Sex: female Admission Date (Current Location): 04/18/2018  [facility] and [place] Number:  Herbalist and Address:  [facility], [place] [address], [place], [place] [address]      Provider Number: [id number]  Attending Physician Name and Address:  Domenic Polite, MD  Relative Name and Phone Number:  Beverlee Nims (daughter) [phone]    Current Level of Care: Hospital Recommended Level of Care: [place] Prior Approval Number:    Date Approved/Denied:   PASRR Number: [id number]  Discharge Plan: SNF    Current Diagnoses: Patient Active Problem List   Diagnosis Date Noted  . DM (diabetes mellitus), type 2 with complications ([place]) 50/35/4656  . ESRD (end stage renal disease) ([place]) 04/19/2018  . Essential hypertension 04/19/2018  . Pelvic fracture ([place]) 04/18/2018    Orientation RESPIRATION BLADDER Height & Weight     Self, Time, Situation, Place  Normal External catheter, Incontinent Weight: 109 lb 5.6 oz (49.6 kg) Height:  5\' 2"  (157.5 cm)  BEHAVIORAL SYMPTOMS/MOOD NEUROLOGICAL BOWEL NUTRITION STATUS      Incontinent Diet(see discharge summary)  AMBULATORY STATUS COMMUNICATION OF NEEDS Skin   Extensive Assist Verbally Normal                       Personal Care Assistance Level of Assistance  Bathing, Feeding, Dressing, Total care Bathing Assistance: Maximum assistance Feeding assistance: Independent Dressing Assistance: Maximum assistance Total Care Assistance: Maximum assistance   Functional Limitations Info  Sight, Hearing, Speech Sight Info: Impaired Hearing Info: Impaired Speech Info: Impaired    SPECIAL CARE FACTORS FREQUENCY  PT (By licensed PT), OT (By licensed OT)     PT Frequency: min 5x weekly OT Frequency: min 3x weekly            Contractures Contractures Info: Not  present    Additional Factors Info  Code Status, Allergies Code Status Info: full Allergies Info: Allergies:  Cinacalcet, Ramipril, Iohexol           Current Medications (04/20/2018):  This is the current hospital active medication list Current Facility-Administered Medications  Medication Dose Route Frequency Provider Last Rate Last Dose  . amLODipine (NORVASC) tablet 5 mg  5 mg Oral Daily Doutova, Anastassia, MD   5 mg at 04/20/18 0933  . atorvastatin (LIPITOR) tablet 40 mg  40 mg Oral QHS Doutova, Anastassia, MD   40 mg at 04/19/18 2200  . bumetanide (BUMEX) tablet 2 mg  2 mg Oral Daily Doutova, Anastassia, MD   2 mg at 04/20/18 0933  . haloperidol lactate (HALDOL) injection 1 mg  1 mg Intravenous Q6H PRN Charlynne Cousins, MD      . heparin injection 5,000 Units  5,000 Units Subcutaneous Q8H Toy Baker, MD   5,000 Units at 04/20/18 0548  . hydrALAZINE (APRESOLINE) injection 5 mg  5 mg Intravenous Q4H PRN Charlynne Cousins, MD      . HYDROcodone-acetaminophen (NORCO/VICODIN) 5-325 MG per tablet 1-2 tablet  1-2 tablet Oral Q4H PRN Charlynne Cousins, MD   1 tablet at 04/20/18 0933  . insulin aspart (novoLOG) injection 0-5 Units  0-5 Units Subcutaneous QHS Doutova, Anastassia, MD      . insulin aspart (novoLOG) injection 0-9 Units  0-9 Units Subcutaneous TID WC Doutova, Anastassia, MD      . insulin detemir (LEVEMIR) injection 30  Units  30 Units Subcutaneous QHS Toy Baker, MD   30 Units at 04/19/18 2201  . morphine 2 MG/ML injection 2 mg  2 mg Intravenous Q4H PRN Doutova, Anastassia, MD      . ondansetron (ZOFRAN) tablet 4 mg  4 mg Oral Q6H PRN Doutova, Anastassia, MD       Or  . ondansetron (ZOFRAN) injection 4 mg  4 mg Intravenous Q6H PRN Doutova, Anastassia, MD      . QUEtiapine (SEROQUEL) tablet 25 mg  25 mg Oral QHS PRN Charlynne Cousins, MD      . traMADol-acetaminophen [facility]) 37.5-325 MG per tablet 1-2 tablet  1-2 tablet Oral Q6H PRN Charlynne Cousins, MD         Discharge Medications: Please see discharge summary for a list of discharge medications.  Relevant Imaging Results:  Relevant Lab Results:   Additional Information [id number]  [employer], LCSW

## 2018-04-21 DIAGNOSIS — S32591A Other specified fracture of right pubis, initial encounter for closed fracture: Secondary | ICD-10-CM | POA: Diagnosis not present

## 2018-04-21 LAB — CBC
HCT: 28.7 % — ABNORMAL LOW (ref 36.0–46.0)
Hemoglobin: 8.9 g/dL — ABNORMAL LOW (ref 12.0–15.0)
MCH: 28.3 pg (ref 26.0–34.0)
MCHC: 31 g/dL (ref 30.0–36.0)
MCV: 91.1 fL (ref 80.0–100.0)
Platelets: 411 10*3/uL — ABNORMAL HIGH (ref 150–400)
RBC: 3.15 MIL/uL — AB (ref 3.87–5.11)
RDW: 14.3 % (ref 11.5–15.5)
WBC: 11 10*3/uL — ABNORMAL HIGH (ref 4.0–10.5)
nRBC: 0 % (ref 0.0–0.2)

## 2018-04-21 LAB — RENAL FUNCTION PANEL
Albumin: 3 g/dL — ABNORMAL LOW (ref 3.5–5.0)
Anion gap: 12 (ref 5–15)
BUN: 46 mg/dL — ABNORMAL HIGH (ref 8–23)
CO2: 22 mmol/L (ref 22–32)
CREATININE: 4.98 mg/dL — AB (ref 0.44–1.00)
Calcium: 9.3 mg/dL (ref 8.9–10.3)
Chloride: 103 mmol/L (ref 98–111)
GFR calc Af Amer: 8 mL/min — ABNORMAL LOW (ref >60)
GFR calc non Af Amer: 7 mL/min — ABNORMAL LOW (ref >60)
Glucose, Bld: 38 mg/dL — CL (ref 70–99)
PHOSPHORUS: 5.7 mg/dL — AB (ref 2.5–4.6)
Potassium: 4.3 mmol/L (ref 3.5–5.1)
Sodium: 137 mmol/L (ref 135–145)

## 2018-04-21 LAB — GLUCOSE, CAPILLARY
Glucose-Capillary: 160 mg/dL — ABNORMAL HIGH (ref 70–99)
Glucose-Capillary: 46 mg/dL — ABNORMAL LOW (ref 70–99)
Glucose-Capillary: 195 mg/dL — ABNORMAL HIGH (ref 70–99)
Glucose-Capillary: 86 mg/dL (ref 70–99)

## 2018-04-21 MED ORDER — HEPARIN SODIUM (PORCINE) 1000 UNIT/ML IJ SOLN
INTRAMUSCULAR | Status: AC
  Administered 2018-04-21: 1000 [IU]
  Filled 2018-04-21: qty 4

## 2018-04-21 MED ORDER — PANTOPRAZOLE SODIUM 40 MG PO TBEC
40.0000 mg | DELAYED_RELEASE_TABLET | Freq: Every day | ORAL | Status: DC
  Administered 2018-04-22 – 2018-04-23 (×2): 40 mg via ORAL
  Filled 2018-04-21 (×2): qty 1

## 2018-04-21 MED ORDER — HYDROCODONE-ACETAMINOPHEN 5-325 MG PO TABS: 1.0000 | ORAL_TABLET | Freq: Four times a day (QID) | ORAL | Status: DC | PRN

## 2018-04-21 MED ORDER — MORPHINE SULFATE (PF) 2 MG/ML IV SOLN: 1.0000 mg | INTRAVENOUS | Status: DC | PRN

## 2018-04-21 MED ORDER — SODIUM CHLORIDE 0.9% FLUSH: 10.0000 mL | INTRAVENOUS | Status: DC | PRN

## 2018-04-21 MED ORDER — INSULIN DETEMIR 100 UNIT/ML ~~LOC~~ SOLN
20.0000 [IU] | Freq: Every day | SUBCUTANEOUS | Status: DC
  Administered 2018-04-21: 20 [IU] via SUBCUTANEOUS
  Filled 2018-04-21: qty 0.2

## 2018-04-21 MED ORDER — NAPROXEN 375 MG PO TABS
375.0000 mg | ORAL_TABLET | Freq: Two times a day (BID) | ORAL | Status: AC
  Administered 2018-04-21 – 2018-04-23 (×4): 375 mg via ORAL
  Filled 2018-04-21 (×4): qty 1

## 2018-04-21 MED ORDER — DEXTROSE 50 % IV SOLN
INTRAVENOUS | Status: AC
  Administered 2018-04-21: 50 mL
  Filled 2018-04-21: qty 50

## 2018-04-21 NOTE — Progress Notes (Signed)
CRITICAL VALUE ALERT  Critical Value:  38 glucose  Date & Time Notied:  Notified by lab @0824  of lab from 0705  Provider Notified: Dr Broadus John via amion   Orders Received/Actions taken: pt's CBG rechecked at 0726 for glucose level of 160. Pt currently eating breakfast.

## 2018-04-21 NOTE — Procedures (Signed)
I was present at this dialysis session. I have reviewed the session itself and made appropriate changes.   UF goal 2L, having some IDH.  2K bath. Qb.    Filed Weights   04/19/18 0140 04/21/18 0855  Weight: 49.6 kg 51 kg    Recent Labs  Lab 04/21/18 0705  NA 137  K 4.3  CL 103  CO2 22  GLUCOSE 38*  BUN 46*  CREATININE 4.98*  CALCIUM 9.3  PHOS 5.7*    Recent Labs  Lab 04/18/18 2007 04/19/18 0241 04/21/18 0705  WBC 9.4 8.8 11.0*  NEUTROABS 6.6  --   --   HGB 9.0* 8.4* 8.9*  HCT 30.2* 26.3* 28.7*  MCV 93.8 90.7 91.1  PLT 383 355 411*    Scheduled Meds: . amLODipine  5 mg Oral Daily  . atorvastatin  40 mg Oral QHS  . bumetanide  2 mg Oral Daily  . Chlorhexidine Gluconate Cloth  6 each Topical Q0600  . heparin  5,000 Units Subcutaneous Q8H  . insulin aspart  0-5 Units Subcutaneous QHS  . insulin aspart  0-9 Units Subcutaneous TID WC  . insulin detemir  30 Units Subcutaneous QHS   Continuous Infusions: PRN Meds:.haloperidol lactate, hydrALAZINE, HYDROcodone-acetaminophen, morphine injection, ondansetron **OR** ondansetron (ZOFRAN) IV, QUEtiapine, sodium chloride flush, traMADol-acetaminophen   Pearson Grippe  MD 04/21/2018, 10:35 AM

## 2018-04-21 NOTE — Progress Notes (Signed)
[facility] PROGRESS NOTE    Progress Note  Mary Aguirre  [id number] DOB: [birth year] DOA: 04/18/2018 PCP: Drake Leach, MD     Brief Narrative:   Mary Aguirre is an 84 y.o. female past medical history of end-stage renal disease on hemodialysis, diabetes mellitus type 2,  rectal cancer and history of breast cancer -History of left-sided pelvic fractures in September following mechanical fall -now with Right sided pelvic fractures sustained following a mechanical fall after hemodialysis on Wednesday  Assessment/Plan:   R Sided pelvic fractures ([place]): -History of similar superior and inferior pubic rami fractures on the left in September -Have severe pain/discomfort with significant limitation in mobility -Add naproxen scheduled, Vicodin for moderate and morphine for severe pain -PT/OT evaluation completed, SNF recommended -Social work consulted for discharge planning  ESRD on hemodialysis Monday Wednesday Friday -Last HD on Wednesday, -Nephrology following, HD today, seen on dialysis -Has a right IJ TDC  Diabetes mellitus type 2 -On Levemir nightly, hemoglobin A1c is only 5.6 -With hypoglycemia will drop down Levemir dose, due to advanced age does not need tight control  Hypertension -stable  Anemia of chronic disease -Aranesp with HD  DVT prophylaxis: lovenox  Full COde Family Communication:None Disposition Plan/Barrier to D/C: SNFwhen bed available, she is medically stable   IV Access:    Peripheral IV   Procedures and diagnostic studies:   No results found.   Medical Consultants:    Renal  Anti-Infectives:   None  Subjective:  -Continues to complain of severe right pelvic pain, unable to move right hip or right leg  Objective:    Vitals:   04/21/18 1115 04/21/18 1145 04/21/18 1200 04/21/18 1347  BP: 101/62 (!) 155/71 (!) 147/74 (!) 148/72  Pulse: 100 (!) 101 98 (!) 101  Resp: 18  18   Temp:   98.4 F (36.9 C) 98.3 F (36.8  C)  TempSrc:   Oral Oral  SpO2:   98% 100%  Weight:   49.5 kg   Height:        Intake/Output Summary (Last 24 hours) at 04/21/2018 1349 Last data filed at 04/21/2018 1200 Gross per 24 hour  Intake 840 ml  Output 1000 ml  Net -160 ml   Filed Weights   04/19/18 0140 04/21/18 0855 04/21/18 1200  Weight: 49.6 kg 51 kg 49.5 kg    Exam: Gen: Chronically ill-appearing frail female, elderly, oriented to self and place, mild cognitive/memory deficits noted HEENT: Right IJ TDC noted Lungs: Good air movement bilaterally, CTAB CVS: RRR,No Gallops,Rubs or new Murmurs Abd: soft, Non tender, non distended, BS present Extremities: Unable to move right hip Skin: no new rashes Psychiatry:  Mood & affect appropriate.    Data Reviewed:    Labs: Basic Metabolic Panel: Recent Labs  Lab 04/18/18 2007 04/19/18 0241 04/21/18 0705  NA 139 138 137  K 3.6 3.5 4.3  CL 103 103 103  CO2 26 25 22   GLUCOSE 114* 190* 38*  BUN 14 18 46*  CREATININE 2.65* 3.15* 4.98*  CALCIUM 9.7 9.4 9.3  MG  --  1.8  --   PHOS  --  5.2* 5.7*   GFR Estimated Creatinine Clearance: 6 mL/min (A) (by C-G formula based on SCr of 4.98 mg/dL (H)). Liver Function Tests: Recent Labs  Lab 04/19/18 0241 04/21/18 0705  AST 26  --   ALT 15  --   ALKPHOS 248*  --   BILITOT 0.6  --   PROT 6.0*  --  ALBUMIN 3.0* 3.0*   No results for input(s): LIPASE, AMYLASE in the last 168 hours. No results for input(s): AMMONIA in the last 168 hours. Coagulation profile No results for input(s): INR, PROTIME in the last 168 hours.  CBC: Recent Labs  Lab 04/18/18 2007 04/19/18 0241 04/21/18 0705  WBC 9.4 8.8 11.0*  NEUTROABS 6.6  --   --   HGB 9.0* 8.4* 8.9*  HCT 30.2* 26.3* 28.7*  MCV 93.8 90.7 91.1  PLT 383 355 411*   Cardiac Enzymes: No results for input(s): CKTOTAL, CKMB, CKMBINDEX, TROPONINI in the last 168 hours. BNP (last 3 results) No results for input(s): PROBNP in the last 8760 hours. CBG: Recent Labs   Lab 04/20/18 1154 04/20/18 1633 04/20/18 2146 04/21/18 0658 04/21/18 0726  GLUCAP 125* 119* 75 46* 160*   D-Dimer: No results for input(s): DDIMER in the last 72 hours. Hgb A1c: Recent Labs    04/19/18 0241  HGBA1C 5.6   Lipid Profile: No results for input(s): CHOL, HDL, LDLCALC, TRIG, CHOLHDL, LDLDIRECT in the last 72 hours. Thyroid function studies: Recent Labs    04/19/18 0241  TSH 1.225   Anemia work up: No results for input(s): VITAMINB12, FOLATE, FERRITIN, TIBC, IRON, RETICCTPCT in the last 72 hours. Sepsis Labs: Recent Labs  Lab 04/18/18 2007 04/19/18 0241 04/21/18 0705  WBC 9.4 8.8 11.0*   Microbiology No results found for this or any previous visit (from the past 240 hour(s)).   Medications:   . amLODipine  5 mg Oral Daily  . atorvastatin  40 mg Oral QHS  . bumetanide  2 mg Oral Daily  . Chlorhexidine Gluconate Cloth  6 each Topical Q0600  . heparin  5,000 Units Subcutaneous Q8H  . insulin aspart  0-5 Units Subcutaneous QHS  . insulin aspart  0-9 Units Subcutaneous TID WC  . insulin detemir  20 Units Subcutaneous QHS   Continuous Infusions:     LOS: 0 days   Domenic Polite  [facility] Page via [email or web address]  04/21/2018, 1:49 PM

## 2018-04-21 NOTE — Plan of Care (Signed)
  Problem: Education: Goal: Knowledge of General Education information will improve Description Including pain rating scale, medication(s)/side effects and non-pharmacologic comfort measures Outcome: Progressing   Problem: Clinical Measurements: Goal: Ability to maintain clinical measurements within normal limits will improve Outcome: Progressing Goal: Will remain free from infection Outcome: Progressing Goal: Diagnostic test results will improve Outcome: Progressing Goal: Respiratory complications will improve Outcome: Progressing   Problem: Nutrition: Goal: Adequate nutrition will be maintained Outcome: Progressing   Problem: Coping: Goal: Level of anxiety will decrease Outcome: Progressing   Problem: Pain Managment: Goal: General experience of comfort will improve Outcome: Progressing   Problem: Safety: Goal: Ability to remain free from injury will improve Outcome: Progressing

## 2018-04-21 NOTE — Progress Notes (Signed)
Hypoglycemic Event  CBG: 46  Treatment: D50 IV 50 mL  Symptoms: None  Follow-up CBG: Time:0726 CBG Result:160  Possible Reasons for Event: Inadequate meal intake  Comments/MD notified:text page Dr. Broadus John at [facility]

## 2018-04-22 DIAGNOSIS — S32591A Other specified fracture of right pubis, initial encounter for closed fracture: Secondary | ICD-10-CM | POA: Diagnosis not present

## 2018-04-22 LAB — BASIC METABOLIC PANEL
Anion gap: 11 (ref 5–15)
BUN: 31 mg/dL — ABNORMAL HIGH (ref 8–23)
CO2: 26 mmol/L (ref 22–32)
Calcium: 9.4 mg/dL (ref 8.9–10.3)
Chloride: 100 mmol/L (ref 98–111)
Creatinine, Ser: 3.98 mg/dL — ABNORMAL HIGH (ref 0.44–1.00)
GFR calc Af Amer: 11 mL/min — ABNORMAL LOW (ref >60)
GFR calc non Af Amer: 9 mL/min — ABNORMAL LOW (ref >60)
Glucose, Bld: 79 mg/dL (ref 70–99)
Potassium: 4.8 mmol/L (ref 3.5–5.1)
SODIUM: 137 mmol/L (ref 135–145)

## 2018-04-22 LAB — CBC
HCT: 28.1 % — ABNORMAL LOW (ref 36.0–46.0)
Hemoglobin: 9 g/dL — ABNORMAL LOW (ref 12.0–15.0)
MCH: 29.3 pg (ref 26.0–34.0)
MCHC: 32 g/dL (ref 30.0–36.0)
MCV: 91.5 fL (ref 80.0–100.0)
PLATELETS: 355 10*3/uL (ref 150–400)
RBC: 3.07 MIL/uL — ABNORMAL LOW (ref 3.87–5.11)
RDW: 14.2 % (ref 11.5–15.5)
WBC: 9 10*3/uL (ref 4.0–10.5)
nRBC: 0 % (ref 0.0–0.2)

## 2018-04-22 LAB — GLUCOSE, CAPILLARY
GLUCOSE-CAPILLARY: 128 mg/dL — AB (ref 70–99)
Glucose-Capillary: 82 mg/dL (ref 70–99)
Glucose-Capillary: 112 mg/dL — ABNORMAL HIGH (ref 70–99)
Glucose-Capillary: 115 mg/dL — ABNORMAL HIGH (ref 70–99)

## 2018-04-22 LAB — HEPATITIS B SURFACE ANTIBODY,QUALITATIVE: Hep B S Ab: REACTIVE

## 2018-04-22 LAB — HEPATITIS B SURFACE ANTIGEN: Hepatitis B Surface Ag: NEGATIVE

## 2018-04-22 LAB — HEPATITIS B CORE ANTIBODY, TOTAL: HEP B C TOTAL AB: POSITIVE — AB

## 2018-04-22 MED ORDER — INSULIN DETEMIR 100 UNIT/ML ~~LOC~~ SOLN
10.0000 [IU] | Freq: Every day | SUBCUTANEOUS | Status: DC
  Filled 2018-04-22 (×2): qty 0.1

## 2018-04-22 NOTE — Progress Notes (Signed)
Admit: 04/18/2018 LOS: 0  53F ESRD [facility] with Fall + pelvic fracture needing SNF  Subjective:  . HD yesterday, 1L UF, tolerated well, using TDC; post weight 49.5kg bedweight . No interval events   11/30 0701 - 12/01 0700 In: 560 [P.O.:560] Out: 1000   Filed Weights   04/19/18 0140 04/21/18 0855 04/21/18 1200  Weight: 49.6 kg 51 kg 49.5 kg    Scheduled Meds: . amLODipine  5 mg Oral Daily  . atorvastatin  40 mg Oral QHS  . bumetanide  2 mg Oral Daily  . Chlorhexidine Gluconate Cloth  6 each Topical Q0600  . heparin  5,000 Units Subcutaneous Q8H  . insulin aspart  0-5 Units Subcutaneous QHS  . insulin aspart  0-9 Units Subcutaneous TID WC  . insulin detemir  20 Units Subcutaneous QHS  . naproxen  375 mg Oral BID WC  . pantoprazole  40 mg Oral Q1200   Continuous Infusions: PRN Meds:.haloperidol lactate, hydrALAZINE, HYDROcodone-acetaminophen, morphine injection, ondansetron **OR** ondansetron (ZOFRAN) IV, QUEtiapine, sodium chloride flush, traMADol-acetaminophen  Current Labs: reviewed    Physical Exam:  Blood pressure (!) 159/62, pulse 96, temperature 98.4 F (36.9 C), temperature source Oral, resp. rate 18, height 5\' 2"  (1.575 m), weight 49.5 kg, SpO2 100 %. Elderly frail, NAD RRR nl s1s2 CTAB S/nt/nd No LEE R IJ TDC Bandaged  A 1. ESRD [facility] via R IJ TDC 2. Falls and b/l pelvic fractures, needs SNF 3. HTN, stable 4. Anemia, holding ESA w/ hx/o rectal cancer 5. Hx/ rectal cancer 6. DM2  P . HD 12/3 if still admitted   Pearson Grippe MD 04/22/2018, 10:01 AM  Recent Labs  Lab 04/19/18 0241 04/21/18 0705 04/22/18 0656  NA 138 137 137  K 3.5 4.3 4.8  CL 103 103 100  CO2 25 22 26   GLUCOSE 190* 38* 79  BUN 18 46* 31*  CREATININE 3.15* 4.98* 3.98*  CALCIUM 9.4 9.3 9.4  PHOS 5.2* 5.7*  --    Recent Labs  Lab 04/18/18 2007 04/19/18 0241 04/21/18 0705 04/22/18 0656  WBC 9.4 8.8 11.0* 9.0  NEUTROABS 6.6  --   --   --   HGB 9.0* 8.4*  8.9* 9.0*  HCT 30.2* 26.3* 28.7* 28.1*  MCV 93.8 90.7 91.1 91.5  PLT 383 355 411* 355

## 2018-04-22 NOTE — Plan of Care (Signed)
  Problem: Education: Goal: Knowledge of General Education information will improve Description: Including pain rating scale, medication(s)/side effects and non-pharmacologic comfort measures Outcome: Progressing   Problem: Health Behavior/Discharge Planning: Goal: Ability to manage health-related needs will improve Outcome: Progressing   Problem: Clinical Measurements: Goal: Ability to maintain clinical measurements within normal limits will improve Outcome: Progressing Goal: Will remain free from infection Outcome: Progressing Goal: Diagnostic test results will improve Outcome: Progressing   Problem: Nutrition: Goal: Adequate nutrition will be maintained Outcome: Progressing   Problem: Pain Managment: Goal: General experience of comfort will improve Outcome: Progressing   Problem: Safety: Goal: Ability to remain free from injury will improve Outcome: Progressing   Problem: Skin Integrity: Goal: Risk for impaired skin integrity will decrease Outcome: Progressing   

## 2018-04-22 NOTE — Progress Notes (Signed)
[facility] PROGRESS NOTE    Progress Note  Mary Aguirre  VOJ:[id number] DOB: 1926/06/12 DOA: 04/18/2018 PCP: Drake Leach, MD     Brief Narrative:   Mary Aguirre is an 84 y.o. female past medical history of end-stage renal disease on hemodialysis, diabetes mellitus type 2,  rectal cancer and history of breast cancer -History of left-sided pelvic fractures in September following mechanical fall -now with Right sided pelvic fractures sustained following a mechanical fall after hemodialysis on Wednesday  Assessment/Plan:   R Sided pelvic fractures ([place]): -History of similar superior and inferior pubic rami fractures on the left in September -Have severe pain/discomfort with significant limitation in mobility -Continue naproxen for 3 days, Vicodin for moderate and morphine for severe pain -PT/OT evaluation completed, SNF recommended -Social work consulted for discharge planning -Remains medically stable for discharge awaiting SNF  ESRD on hemodialysis TTS -Last HD on Wednesday, -Nephrology following, dialyzed yesterday  -Has a right IJ TDC  Diabetes mellitus type 2 -On Levemir nightly, hemoglobin A1c is only 5.6 -We will drop down Levemir dose further, due to advanced age does not need tight control  Hypertension -stable  Anemia of chronic disease -Aranesp with HD  DVT prophylaxis: lovenox  Full COde Family Communication:None Disposition Plan/Barrier to D/C: SNFwhen bed available, she is medically stable   Medical Consultants:    Renal  Anti-Infectives:   None  Subjective:  -Better, continues to have right pelvic discomfort and limited mobility but a little better  Objective:    Vitals:   04/21/18 1200 04/21/18 1347 04/21/18 2042 04/22/18 0646  BP: (!) 147/74 (!) 148/72 (!) 152/65 (!) 159/62  Pulse: 98 (!) 101 92 96  Resp: 18     Temp: 98.4 F (36.9 C) 98.3 F (36.8 C) 97.8 F (36.6 C) 98.4 F (36.9 C)  TempSrc: Oral Oral Oral Oral    SpO2: 98% 100% 100% 100%  Weight: 49.5 kg     Height:        Intake/Output Summary (Last 24 hours) at 04/22/2018 1336 Last data filed at 04/21/2018 1622 Gross per 24 hour  Intake 200 ml  Output -  Net 200 ml   Filed Weights   04/19/18 0140 04/21/18 0855 04/21/18 1200  Weight: 49.6 kg 51 kg 49.5 kg    Exam: Gen: Elderly, chronically ill-appearing female, oriented to self and place, mild cognitive deficits noted HEENT: Right IJ TDC noted  lungs: Good air movement bilaterally, CTAB CVS: RRR,No Gallops,Rubs or new Murmurs Abd: soft, Non tender, non distended, BS present Extremities: No edema, very limited right hip mobility due to pain Skin: no new rashes Psychiatry:  Mood & affect appropriate.    Data Reviewed:    Labs: Basic Metabolic Panel: Recent Labs  Lab 04/18/18 2007 04/19/18 0241 04/21/18 0705 04/22/18 0656  NA 139 138 137 137  K 3.6 3.5 4.3 4.8  CL 103 103 103 100  CO2 26 25 22 26   GLUCOSE 114* 190* 38* 79  BUN 14 18 46* 31*  CREATININE 2.65* 3.15* 4.98* 3.98*  CALCIUM 9.7 9.4 9.3 9.4  MG  --  1.8  --   --   PHOS  --  5.2* 5.7*  --    GFR Estimated Creatinine Clearance: 7.5 mL/min (A) (by C-G formula based on SCr of 3.98 mg/dL (H)). Liver Function Tests: Recent Labs  Lab 04/19/18 0241 04/21/18 0705  AST 26  --   ALT 15  --   ALKPHOS 248*  --  BILITOT 0.6  --   PROT 6.0*  --   ALBUMIN 3.0* 3.0*   No results for input(s): LIPASE, AMYLASE in the last 168 hours. No results for input(s): AMMONIA in the last 168 hours. Coagulation profile No results for input(s): INR, PROTIME in the last 168 hours.  CBC: Recent Labs  Lab 04/18/18 2007 04/19/18 0241 04/21/18 0705 04/22/18 0656  WBC 9.4 8.8 11.0* 9.0  NEUTROABS 6.6  --   --   --   HGB 9.0* 8.4* 8.9* 9.0*  HCT 30.2* 26.3* 28.7* 28.1*  MCV 93.8 90.7 91.1 91.5  PLT 383 355 411* 355   Cardiac Enzymes: No results for input(s): CKTOTAL, CKMB, CKMBINDEX, TROPONINI in the last 168 hours. BNP  (last 3 results) No results for input(s): PROBNP in the last 8760 hours. CBG: Recent Labs  Lab 04/21/18 0726 04/21/18 1634 04/21/18 2157 04/22/18 0648 04/22/18 1205  GLUCAP 160* 195* 86 82 112*   D-Dimer: No results for input(s): DDIMER in the last 72 hours. Hgb A1c: No results for input(s): HGBA1C in the last 72 hours. Lipid Profile: No results for input(s): CHOL, HDL, LDLCALC, TRIG, CHOLHDL, LDLDIRECT in the last 72 hours. Thyroid function studies: No results for input(s): TSH, T4TOTAL, T3FREE, THYROIDAB in the last 72 hours.  Invalid input(s): FREET3 Anemia work up: No results for input(s): VITAMINB12, FOLATE, FERRITIN, TIBC, IRON, RETICCTPCT in the last 72 hours. Sepsis Labs: Recent Labs  Lab 04/18/18 2007 04/19/18 0241 04/21/18 0705 04/22/18 0656  WBC 9.4 8.8 11.0* 9.0   Microbiology No results found for this or any previous visit (from the past 240 hour(s)).   Medications:   . amLODipine  5 mg Oral Daily  . atorvastatin  40 mg Oral QHS  . bumetanide  2 mg Oral Daily  . Chlorhexidine Gluconate Cloth  6 each Topical Q0600  . heparin  5,000 Units Subcutaneous Q8H  . insulin aspart  0-5 Units Subcutaneous QHS  . insulin aspart  0-9 Units Subcutaneous TID WC  . insulin detemir  10 Units Subcutaneous QHS  . naproxen  375 mg Oral BID WC  . pantoprazole  40 mg Oral Q1200   Continuous Infusions:     LOS: 0 days   Domenic Polite  [facility] Page via [email or web address]  04/22/2018, 1:36 PM

## 2018-04-23 DIAGNOSIS — S32591A Other specified fracture of right pubis, initial encounter for closed fracture: Secondary | ICD-10-CM | POA: Diagnosis not present

## 2018-04-23 LAB — GLUCOSE, CAPILLARY
GLUCOSE-CAPILLARY: 115 mg/dL — AB (ref 70–99)
GLUCOSE-CAPILLARY: 82 mg/dL (ref 70–99)
Glucose-Capillary: 91 mg/dL (ref 70–99)

## 2018-04-23 MED ORDER — INSULIN DETEMIR 100 UNIT/ML FLEXPEN: 6.0000 [IU] | PEN_INJECTOR | Freq: Every day | SUBCUTANEOUS | 11 refills | Status: DC

## 2018-04-23 MED ORDER — HYDROCODONE-ACETAMINOPHEN 5-325 MG PO TABS: 1.0000 | ORAL_TABLET | Freq: Four times a day (QID) | ORAL | 0 refills | Status: DC | PRN

## 2018-04-23 NOTE — Progress Notes (Signed)
Patient will DC to: [facility] Anticipated DC date: 04/23/18 Family notified:[name] Transport by: Corey Harold  Per MD patient ready for DC to [facility] . RN, patient, patient's family, and facility notified of DC. Discharge Summary sent to facility. RN given number for report [phone] Room 705P. DC packet on chart. Ambulance transport requested for patient for 3:00 pm.  CSW signing off.  [place], [place]

## 2018-04-23 NOTE — Plan of Care (Signed)
  Problem: Education: Goal: Knowledge of General Education information will improve Description: Including pain rating scale, medication(s)/side effects and non-pharmacologic comfort measures Outcome: Adequate for Discharge   Problem: Health Behavior/Discharge Planning: Goal: Ability to manage health-related needs will improve Outcome: Adequate for Discharge   Problem: Clinical Measurements: Goal: Ability to maintain clinical measurements within normal limits will improve Outcome: Adequate for Discharge Goal: Will remain free from infection Outcome: Adequate for Discharge Goal: Diagnostic test results will improve Outcome: Adequate for Discharge Goal: Respiratory complications will improve Outcome: Adequate for Discharge Goal: Cardiovascular complication will be avoided Outcome: Adequate for Discharge   Problem: Activity: Goal: Risk for activity intolerance will decrease Outcome: Adequate for Discharge   Problem: Nutrition: Goal: Adequate nutrition will be maintained Outcome: Adequate for Discharge   Problem: Coping: Goal: Level of anxiety will decrease Outcome: Adequate for Discharge   Problem: Elimination: Goal: Will not experience complications related to bowel motility Outcome: Adequate for Discharge Goal: Will not experience complications related to urinary retention Outcome: Adequate for Discharge   Problem: Safety: Goal: Ability to remain free from injury will improve Outcome: Adequate for Discharge   Problem: Skin Integrity: Goal: Risk for impaired skin integrity will decrease Outcome: Adequate for Discharge   

## 2018-04-23 NOTE — Progress Notes (Signed)
Admit: 04/18/2018 LOS: 0  67F ESRD [facility] with Fall + pelvic fracture needing SNF  Subjective:  . Feeling well.  For d/c today.  No intake/output data recorded.  Filed Weights   04/19/18 0140 04/21/18 0855 04/21/18 1200  Weight: 49.6 kg 51 kg 49.5 kg    Scheduled Meds: . amLODipine  5 mg Oral Daily  . atorvastatin  40 mg Oral QHS  . bumetanide  2 mg Oral Daily  . Chlorhexidine Gluconate Cloth  6 each Topical Q0600  . heparin  5,000 Units Subcutaneous Q8H  . insulin aspart  0-5 Units Subcutaneous QHS  . insulin aspart  0-9 Units Subcutaneous TID WC  . insulin detemir  10 Units Subcutaneous QHS  . pantoprazole  40 mg Oral Q1200   Continuous Infusions: PRN Meds:.haloperidol lactate, hydrALAZINE, HYDROcodone-acetaminophen, morphine injection, ondansetron **OR** ondansetron (ZOFRAN) IV, QUEtiapine, sodium chloride flush, traMADol-acetaminophen  Current Labs: reviewed    Physical Exam:  Blood pressure (!) 167/58, pulse 98, temperature 98.2 F (36.8 C), temperature source Oral, resp. rate 18, height 5\' 2"  (1.575 m), weight 49.5 kg, SpO2 100 %. GEN: Elderly frail, NAD CV: RRR nl s1s2 PULM: CTAB ABD: S/nt/nd EXT: No LEE ACCESS R IJ TDC dressed, no drainage  A/P 1. ESRD [facility] via R IJ TDC- for d/c today.  We are doing no more than 1L UF due to small body habitus and the fact that pt still makes urine. 2. Falls and b/l pelvic fractures, needs SNF 3. HTN, stable 4. Anemia, holding ESA w/ hx/o rectal cancer 5. Hx/ rectal cancer 6. DM2   Madelon Lips MD 04/23/2018, 12:32 PM  Recent Labs  Lab 04/19/18 0241 04/21/18 0705 04/22/18 0656  NA 138 137 137  K 3.5 4.3 4.8  CL 103 103 100  CO2 25 22 26   GLUCOSE 190* 38* 79  BUN 18 46* 31*  CREATININE 3.15* 4.98* 3.98*  CALCIUM 9.4 9.3 9.4  PHOS 5.2* 5.7*  --    Recent Labs  Lab 04/18/18 2007 04/19/18 0241 04/21/18 0705 04/22/18 0656  WBC 9.4 8.8 11.0* 9.0  NEUTROABS 6.6  --   --   --   HGB 9.0* 8.4*  8.9* 9.0*  HCT 30.2* 26.3* 28.7* 28.1*  MCV 93.8 90.7 91.1 91.5  PLT 383 355 411* 355

## 2018-04-23 NOTE — Clinical Social Work Placement (Signed)
   CLINICAL SOCIAL WORK PLACEMENT  NOTE  Date:  04/23/2018  Patient Details  Name: Mary Aguirre MRN: [id number] Date of Birth: 10-11-1926  Clinical Social Work is seeking post-discharge placement for this patient at the [place] level of care (*CSW will initial, date and re-position this form in  chart as items are completed):      Patient/family provided with [facility] Work Department's list of facilities offering this level of care within the geographic area requested by the patient (or if unable, by the patient's family).      Patient/family informed of their freedom to choose among providers that offer the needed level of care, that participate in Medicare, Medicaid or managed care program needed by the patient, have an available bed and are willing to accept the patient.      Patient/family informed of [facility]'s ownership interest in [facility] and [facility], as well as of the fact that they are under no obligation to receive care at these facilities.  PASRR submitted to EDS on       PASRR number received on 04/20/18     Existing PASRR number confirmed on       FL2 transmitted to all facilities in geographic area requested by pt/family on 04/20/18     FL2 transmitted to all facilities within larger geographic area on       Patient informed that his/her managed care company has contracts with or will negotiate with certain facilities, including the following:        Yes   Patient/family informed of bed offers received.  Patient chooses bed at [facility]     Physician recommends and patient chooses bed at      Patient to be transferred to [facility] on 04/23/18.  Patient to be transferred to facility by [facility]     Patient family notified on 04/23/18 of transfer.  Name of family member notified:  [name]     PHYSICIAN       Additional Comment:    _______________________________________________ Alberteen Sam,  LCSW 04/23/2018, 1:14 PM

## 2018-04-23 NOTE — Discharge Summary (Signed)
Physician Discharge Summary  Mary Aguirre WUJ:[id number] DOB: 02-Jul-1926 DOA: 04/18/2018  PCP: Drake Leach, MD  Admit date: 04/18/2018 Discharge date: 04/23/2018  Time spent: 35 minutes  Recommendations for Outpatient Follow-up:  1. PCP in 1 week 2. SNF for Rehab 3. Continue hemodialysis Tuesday Thursday Saturday   Discharge Diagnoses:  Active Problems:   Right Pelvic fractures(HCC)   DM (diabetes mellitus), type 2 with complications (HCC)   ESRD (end stage renal disease) ([place])   Essential hypertension   Mild Cognitive dysfunction   Discharge Condition: Stable  Diet recommendation: Renal, diabetic  Filed Weights   04/19/18 0140 04/21/18 0855 04/21/18 1200  Weight: 49.6 kg 51 kg 49.5 kg    History of present illness:  Mary Aguirre is an 84 y.o. female past medical history of end-stage renal disease on hemodialysis, diabetes mellitus type 2,  rectal cancer and history of breast cancer -History of left-sided pelvic fractures in September following mechanical fall -now with Right sided pelvic fractures sustained following a mechanical fall after hemodialysis on Wednesday  Hospital Course:   R Sided pelvic fractures [facility]): -History of similar superior and inferior pubic rami fractures on the left in September -Admitted following mechanical fall and now with right-sided pelvic fractures  -Treated with supportive care, given NSAIDs inpatient along with Vicodin and morphine as needed  -PT/OT evaluation completed, SNF recommended -She will be discharged to SNF for short-term rehab  ESRD on hemodialysis TTS -Last HD on Wednesday, -Nephrology following, dialyzed yesterday  -Has a right IJ TDC -She still makes some urine, also maintained on Bumex  Diabetes mellitus type 2 -On Levemir 30 units nightly at baseline, hemoglobin A1c is only 5.6 -Did have some hypoglycemic episodes inpatient we have cut down her Levemir dose significantly to 6 units nightly, due to advanced  age does not need tight control  Hypertension -stable  Anemia of chronic disease -Aranesp with HD -Stable Discharge Exam: Vitals:   04/23/18 0431 04/23/18 0850  BP: (!) 168/68 (!) 167/58  Pulse: 98   Resp:    Temp: 98.2 F (36.8 C)   SpO2: 100%     General: AAOx2, cognitive deficits noted Cardiovascular: S!S2/RRR Respiratory: CTAB  Discharge Instructions   Discharge Instructions    Discharge instructions   Complete by:  As directed    Renal diabetic diet   Increase activity slowly   Complete by:  As directed      Allergies as of 04/23/2018      Reactions   Cinacalcet Other (See Comments)   Ramipril Other (See Comments)   unknown   Iohexol     Desc: hives;pt pre-medicated with 13 hr prep did fine-02/15/05      Medication List    TAKE these medications   amLODipine 5 MG tablet Commonly known as:  NORVASC Take 5 mg by mouth daily.   atorvastatin 40 MG tablet Commonly known as:  LIPITOR Take 40 mg by mouth at bedtime.   bumetanide 2 MG tablet Commonly known as:  BUMEX Take 2 mg by mouth daily.   HYDROcodone-acetaminophen 5-325 MG tablet Commonly known as:  NORCO/VICODIN Take 1 tablet by mouth every 6 (six) hours as needed for severe pain.   Insulin Detemir 100 UNIT/ML Pen Commonly known as:  LEVEMIR Inject 6 Units into the skin at bedtime. What changed:  how much to take      Allergies  Allergen Reactions  . Cinacalcet Other (See Comments)  . Ramipril Other (See Comments)    unknown  .  Iohexol      Desc: hives;pt pre-medicated with 13 hr prep did fine-02/15/05    Contact information for after-discharge care    Destination    [facility] Preferred SNF .   Service:  Skilled Nursing Contact information: [place] [phone]               The results of significant diagnostics from this hospitalization (including imaging, microbiology, ancillary and laboratory) are listed below for  reference.    Significant Diagnostic Studies: Ct Knee Right Wo Contrast  Result Date: 04/18/2018 CLINICAL DATA:  Fall with right-sided knee pain EXAM: CT OF THE right KNEE WITHOUT CONTRAST TECHNIQUE: Multidetector CT imaging of the right knee was performed according to the standard protocol. Multiplanar CT image reconstructions were also generated. COMPARISON:  04/18/2018, 04/02/2018 FINDINGS: Bones/Joint/Cartilage No acute displaced fracture or malalignment. Mild patellofemoral degenerative changes. Mild medial and lateral joint space degenerative change. No significant knee effusion. Small cortical defect proximal metaphysis of the tibia, corresponding to radiographic abnormality. This could be secondary to remote trauma. No associated soft tissue mass. No periostitis. Ligaments Suboptimally assessed by CT. Muscles and Tendons Quadriceps and patellar tendon appear intact. No intramuscular hematoma. Mild muscular atrophy. Soft tissues Vascular calcifications in the popliteal fossa. IMPRESSION: 1. No acute displaced fracture or malalignment 2. Mild arthritis of the knee 3. Small cortical defect proximal metaphysis of the tibia without associated soft tissue mass. This is of uncertain etiology, possibly a small focus of remote trauma. Electronically Signed   By: Donavan Foil M.D.   On: 04/18/2018 23:10   Ct Hip Right Wo Contrast  Result Date: 04/18/2018 CLINICAL DATA:  84 year old post fall while at dialysis today. Right pelvic pain. EXAM: CT OF THE RIGHT HIP WITHOUT CONTRAST TECHNIQUE: Multidetector CT imaging of the right hip was performed according to the standard protocol. Multiplanar CT image reconstructions were also generated. COMPARISON:  Radiograph earlier this day. Pelvis and right hip radiographs 04/02/2018 FINDINGS: Bones/Joint/Cartilage Acute mildly displaced fracture of the right puboacetabular junction. This does not extend to the acetabular articular cortex. Acute the right femoral head is  well seated. Nondisplaced right inferior pubic ramus fracture. Full field of view imaging of the pelvis demonstrates a comminuted left superior pubic ramus fracture and displaced left inferior pubis ramus fracture, seen on 01/27/2018 radiograph. Fracture component at the left puboacetabular junction is likely chronic but technically age indeterminate. Bilateral sacroiliac joint widening appears chronic and may be secondary to renal osteodystrophy. There is pubic symphyseal widening which is also chronic. Diffuse bony under mineralization. Ligaments Suboptimally assessed by CT. Muscles and Tendons No intramuscular hematoma.  Enthesopathy at the greater trochanter. Soft tissues Soft tissue contusion lateral to the right hip. Colonic diverticulosis. Advanced pelvic vascular calcifications. Enteric sutures in the rectosigmoid colon. IMPRESSION: 1. Acute mildly displaced fracture of the right superior pubic ramus at the puboacetabular junction. Acute nondisplaced right inferior pubic ramus fracture. 2. Subacute/remote left pubic rami fractures, present since at least 01/27/2018 left hip radiograph. 3. Soft tissue contusion lateral to the right hip. 4. Additional chronic findings as described. Electronically Signed   By: Keith Rake M.D.   On: 04/18/2018 23:09   Dg Knee Complete 4 Views Left  Result Date: 04/18/2018 CLINICAL DATA:  [place] today EXAM: LEFT KNEE - COMPLETE 4+ VIEW COMPARISON:  01/27/2018 FINDINGS: Osseous demineralization. Joint spaces preserved. Subtle cortical deformity at the medial aspect of the LEFT tibial metadiaphysis, less pronounced than on RIGHT, likely benign or  old. No acute fracture, dislocation, or bone destruction. No knee joint effusion. IMPRESSION: No acute osseous abnormalities. Electronically Signed   By: Lavonia Dana M.D.   On: 04/18/2018 19:16   Dg Knee Complete 4 Views Right  Result Date: 04/18/2018 CLINICAL DATA:  [place] today EXAM: RIGHT KNEE - COMPLETE 4+ VIEW COMPARISON:   04/02/2018 FINDINGS: Diffuse osseous demineralization. Joint spaces preserved. Chronic deformity of the cortex at the medial aspect of the proximal tibial metadiaphysis, unchanged from previous exam. No interval bony resorption or periosteal reaction to suggest this is an acute finding. No acute fracture, dislocation or bone destruction. No knee joint effusion. IMPRESSION: Osseous demineralization. Chronic cortical deformity at the medial aspect of the proximal tibial metadiaphysis; the lack of interval change since the prior study of 04/02/2018 suggest this is an old rather than evolving acute finding. No definite acute bony abnormalities. If patient has persistent pain referable to the proximal tibia, recommend CT or MR assessment. Electronically Signed   By: Lavonia Dana M.D.   On: 04/18/2018 19:14   Dg Foot 2 Views Right  Result Date: 04/19/2018 CLINICAL DATA:  Right foot pain after fall. EXAM: RIGHT FOOT - 2 VIEW COMPARISON:  Radiographs 04/02/2018 FINDINGS: Hammertoe deformity of the digits. The bones are under mineralized. No acute fracture or dislocation. Probable os peroneal. No focal soft tissue abnormality. Vascular calcifications. IMPRESSION: No acute fracture or dislocation of the right foot. Electronically Signed   By: Keith Rake M.D.   On: 04/19/2018 00:55   Dg Hip Unilat W Or Wo Pelvis 2-3 Views Right  Result Date: 04/18/2018 CLINICAL DATA:  Fall EXAM: DG HIP (WITH OR WITHOUT PELVIS) 2-3V RIGHT COMPARISON:  04/02/2018, 02/28/2018, on 01/27/2018 FINDINGS: Osseous demineralization. Hip joint spaces preserved. SI joints are inadequately visualized due to degree of bony demineralization. Fractures of the LEFT superior and inferior pubic rami again identified with significant resorption at the fractures since 01/27/2018. Additional fracture plane laterally at the LEFT superior pubic ramus was not visualized on 01/27/2018 but is present on 04/02/2018. No additional fracture, dislocation or  bone destruction. IMPRESSION: Fractures of the LEFT superior and inferior pubic rami as above, unchanged. Electronically Signed   By: Lavonia Dana M.D.   On: 04/18/2018 19:20    Microbiology: No results found for this or any previous visit (from the past 240 hour(s)).   Labs: Basic Metabolic Panel: Recent Labs  Lab 04/18/18 2007 04/19/18 0241 04/21/18 0705 04/22/18 0656  NA 139 138 137 137  K 3.6 3.5 4.3 4.8  CL 103 103 103 100  CO2 26 25 22 26   GLUCOSE 114* 190* 38* 79  BUN 14 18 46* 31*  CREATININE 2.65* 3.15* 4.98* 3.98*  CALCIUM 9.7 9.4 9.3 9.4  MG  --  1.8  --   --   PHOS  --  5.2* 5.7*  --    Liver Function Tests: Recent Labs  Lab 04/19/18 0241 04/21/18 0705  AST 26  --   ALT 15  --   ALKPHOS 248*  --   BILITOT 0.6  --   PROT 6.0*  --   ALBUMIN 3.0* 3.0*   No results for input(s): LIPASE, AMYLASE in the last 168 hours. No results for input(s): AMMONIA in the last 168 hours. CBC: Recent Labs  Lab 04/18/18 2007 04/19/18 0241 04/21/18 0705 04/22/18 0656  WBC 9.4 8.8 11.0* 9.0  NEUTROABS 6.6  --   --   --   HGB 9.0* 8.4* 8.9* 9.0*  HCT  30.2* 26.3* 28.7* 28.1*  MCV 93.8 90.7 91.1 91.5  PLT 383 355 411* 355   Cardiac Enzymes: No results for input(s): CKTOTAL, CKMB, CKMBINDEX, TROPONINI in the last 168 hours. BNP: BNP (last 3 results) No results for input(s): BNP in the last 8760 hours.  ProBNP (last 3 results) No results for input(s): PROBNP in the last 8760 hours.  CBG: Recent Labs  Lab 04/22/18 0648 04/22/18 1205 04/22/18 1636 04/22/18 2126 04/23/18 0647  GLUCAP 82 112* 115* 128* 91       Signed:  Domenic Polite MD.  [facility] 04/23/2018, 10:40 AM

## 2018-04-23 NOTE — Progress Notes (Signed)
PT Cancellation Note  Patient Details Name: Mary Aguirre MRN: [id number] DOB: 07-22-1926   Cancelled Treatment:    Reason Eval/Treat Not Completed: Other (comment) per chart review, patient was recommended SNF by PT and is discharging to a SNF facility today. Hold PT  Per guidelines due to DC to facility today.    Deniece Ree PT, DPT, CBIS  Supplemental Physical Therapist [facility]    Pager [phone] Acute Rehab Office [phone]

## 2018-06-13 ENCOUNTER — Emergency Department (HOSPITAL_COMMUNITY): Payer: Medicare Other

## 2018-06-13 ENCOUNTER — Other Ambulatory Visit: Payer: Self-pay

## 2018-06-13 ENCOUNTER — Observation Stay (HOSPITAL_COMMUNITY)
Admission: EM | Admit: 2018-06-13 | Discharge: 2018-06-18 | Disposition: A | Discharge status: Skilled Nursing Facility | Payer: Medicare Other | Attending: Internal Medicine | Admitting: Internal Medicine

## 2018-06-13 ENCOUNTER — Encounter (HOSPITAL_COMMUNITY): Payer: Self-pay | Admitting: *Deleted

## 2018-06-13 DIAGNOSIS — I10 Essential (primary) hypertension: Secondary | ICD-10-CM

## 2018-06-13 DIAGNOSIS — S42102A Fracture of unspecified part of scapula, left shoulder, initial encounter for closed fracture: Secondary | ICD-10-CM | POA: Diagnosis not present

## 2018-06-13 DIAGNOSIS — M25512 Pain in left shoulder: Secondary | ICD-10-CM | POA: Diagnosis present

## 2018-06-13 DIAGNOSIS — E118 Type 2 diabetes mellitus with unspecified complications: Secondary | ICD-10-CM | POA: Diagnosis not present

## 2018-06-13 DIAGNOSIS — I120 Hypertensive chronic kidney disease with stage 5 chronic kidney disease or end stage renal disease: Secondary | ICD-10-CM | POA: Insufficient documentation

## 2018-06-13 DIAGNOSIS — N186 End stage renal disease: Secondary | ICD-10-CM | POA: Diagnosis not present

## 2018-06-13 DIAGNOSIS — Z992 Dependence on renal dialysis: Secondary | ICD-10-CM | POA: Diagnosis not present

## 2018-06-13 DIAGNOSIS — E162 Hypoglycemia, unspecified: Secondary | ICD-10-CM | POA: Diagnosis present

## 2018-06-13 DIAGNOSIS — Y92009 Unspecified place in unspecified non-institutional (private) residence as the place of occurrence of the external cause: Secondary | ICD-10-CM

## 2018-06-13 DIAGNOSIS — E440 Moderate protein-calorie malnutrition: Secondary | ICD-10-CM | POA: Diagnosis not present

## 2018-06-13 DIAGNOSIS — Z79899 Other long term (current) drug therapy: Secondary | ICD-10-CM | POA: Insufficient documentation

## 2018-06-13 DIAGNOSIS — S2242XA Multiple fractures of ribs, left side, initial encounter for closed fracture: Secondary | ICD-10-CM

## 2018-06-13 DIAGNOSIS — W19XXXA Unspecified fall, initial encounter: Secondary | ICD-10-CM | POA: Insufficient documentation

## 2018-06-13 DIAGNOSIS — E279 Disorder of adrenal gland, unspecified: Secondary | ICD-10-CM | POA: Diagnosis not present

## 2018-06-13 DIAGNOSIS — E1122 Type 2 diabetes mellitus with diabetic chronic kidney disease: Secondary | ICD-10-CM | POA: Insufficient documentation

## 2018-06-13 DIAGNOSIS — S42112A Displaced fracture of body of scapula, left shoulder, initial encounter for closed fracture: Secondary | ICD-10-CM

## 2018-06-13 DIAGNOSIS — E11649 Type 2 diabetes mellitus with hypoglycemia without coma: Secondary | ICD-10-CM | POA: Diagnosis not present

## 2018-06-13 DIAGNOSIS — T148XXA Other injury of unspecified body region, initial encounter: Secondary | ICD-10-CM

## 2018-06-13 LAB — CBG MONITORING, ED
Glucose-Capillary: 85 mg/dL (ref 70–99)
Glucose-Capillary: 153 mg/dL — ABNORMAL HIGH (ref 70–99)
Glucose-Capillary: 117 mg/dL — ABNORMAL HIGH (ref 70–99)

## 2018-06-13 LAB — URINALYSIS, ROUTINE W REFLEX MICROSCOPIC
Bacteria, UA: NONE SEEN
Bilirubin Urine: NEGATIVE
Glucose, UA: 50 mg/dL — AB
Hgb urine dipstick: NEGATIVE
Ketones, ur: NEGATIVE mg/dL
Leukocytes, UA: NEGATIVE
Nitrite: NEGATIVE
Protein, ur: 100 mg/dL — AB
Specific Gravity, Urine: 1.006 (ref 1.005–1.030)
pH: 9 — ABNORMAL HIGH (ref 5.0–8.0)

## 2018-06-13 LAB — CBC WITH DIFFERENTIAL/PLATELET
Abs Immature Granulocytes: 0.05 10*3/uL (ref 0.00–0.07)
Basophils Absolute: 0 10*3/uL (ref 0.0–0.1)
Basophils Relative: 0 %
Eosinophils Absolute: 0 10*3/uL (ref 0.0–0.5)
Eosinophils Relative: 0 %
HCT: 29.2 % — ABNORMAL LOW (ref 36.0–46.0)
HEMOGLOBIN: 9.4 g/dL — AB (ref 12.0–15.0)
Immature Granulocytes: 1 %
LYMPHS ABS: 0.9 10*3/uL (ref 0.7–4.0)
LYMPHS PCT: 10 %
MCH: 30.6 pg (ref 26.0–34.0)
MCHC: 32.2 g/dL (ref 30.0–36.0)
MCV: 95.1 fL (ref 80.0–100.0)
Monocytes Absolute: 0.3 10*3/uL (ref 0.1–1.0)
Monocytes Relative: 4 %
Neutro Abs: 7.7 10*3/uL (ref 1.7–7.7)
Neutrophils Relative %: 85 %
Platelets: 289 10*3/uL (ref 150–400)
RBC: 3.07 MIL/uL — ABNORMAL LOW (ref 3.87–5.11)
RDW: 15.4 % (ref 11.5–15.5)
WBC: 8.9 10*3/uL (ref 4.0–10.5)
nRBC: 0 % (ref 0.0–0.2)

## 2018-06-13 LAB — COMPREHENSIVE METABOLIC PANEL
ALK PHOS: 333 U/L — AB (ref 38–126)
ALT: 13 U/L (ref 0–44)
AST: 22 U/L (ref 15–41)
Albumin: 3.6 g/dL (ref 3.5–5.0)
Anion gap: 13 (ref 5–15)
BUN: 19 mg/dL (ref 8–23)
CO2: 24 mmol/L (ref 22–32)
Calcium: 8.7 mg/dL — ABNORMAL LOW (ref 8.9–10.3)
Chloride: 101 mmol/L (ref 98–111)
Creatinine, Ser: 3.87 mg/dL — ABNORMAL HIGH (ref 0.44–1.00)
GFR calc Af Amer: 11 mL/min — ABNORMAL LOW (ref >60)
GFR calc non Af Amer: 10 mL/min — ABNORMAL LOW (ref >60)
Glucose, Bld: 94 mg/dL (ref 70–99)
Potassium: 4.7 mmol/L (ref 3.5–5.1)
Sodium: 138 mmol/L (ref 135–145)
Total Bilirubin: 0.3 mg/dL (ref 0.3–1.2)
Total Protein: 7 g/dL (ref 6.5–8.1)

## 2018-06-13 LAB — LIPASE, BLOOD: Lipase: 78 U/L — ABNORMAL HIGH (ref 11–51)

## 2018-06-13 LAB — GLUCOSE, CAPILLARY: Glucose-Capillary: 137 mg/dL — ABNORMAL HIGH (ref 70–99)

## 2018-06-13 MED ORDER — HEPARIN SODIUM (PORCINE) 5000 UNIT/ML IJ SOLN
5000.0000 [IU] | Freq: Three times a day (TID) | INTRAMUSCULAR | Status: DC
  Administered 2018-06-16 – 2018-06-18 (×4): 5000 [IU] via SUBCUTANEOUS
  Filled 2018-06-13 (×9): qty 1

## 2018-06-13 MED ORDER — FENTANYL CITRATE (PF) 100 MCG/2ML IJ SOLN
50.0000 ug | Freq: Once | INTRAMUSCULAR | Status: AC
  Administered 2018-06-13: 50 ug via INTRAVENOUS
  Filled 2018-06-13: qty 2

## 2018-06-13 MED ORDER — AMLODIPINE BESYLATE 5 MG PO TABS
5.0000 mg | ORAL_TABLET | Freq: Every day | ORAL | Status: DC
  Administered 2018-06-14 – 2018-06-18 (×4): 5 mg via ORAL
  Filled 2018-06-13 (×5): qty 1

## 2018-06-13 MED ORDER — NEPRO/CARBSTEADY PO LIQD: 237.0000 mL | Freq: Two times a day (BID) | ORAL | Status: DC

## 2018-06-13 MED ORDER — ONDANSETRON HCL 4 MG/2ML IJ SOLN: 4.0000 mg | Freq: Four times a day (QID) | INTRAMUSCULAR | Status: DC | PRN

## 2018-06-13 MED ORDER — SODIUM CHLORIDE 0.9% FLUSH
3.0000 mL | INTRAVENOUS | Status: DC | PRN
  Administered 2018-06-16: 3 mL via INTRAVENOUS
  Filled 2018-06-13: qty 3

## 2018-06-13 MED ORDER — ACETAMINOPHEN 325 MG PO TABS
650.0000 mg | ORAL_TABLET | Freq: Four times a day (QID) | ORAL | Status: DC | PRN
  Administered 2018-06-15 – 2018-06-18 (×8): 650 mg via ORAL
  Filled 2018-06-13 (×8): qty 2

## 2018-06-13 MED ORDER — NEPRO/CARBSTEADY PO LIQD
237.0000 mL | Freq: Two times a day (BID) | ORAL | Status: DC
  Administered 2018-06-14 – 2018-06-18 (×9): 237 mL via ORAL

## 2018-06-13 MED ORDER — ACETAMINOPHEN 650 MG RE SUPP: 650.0000 mg | Freq: Four times a day (QID) | RECTAL | Status: DC | PRN

## 2018-06-13 MED ORDER — ATORVASTATIN CALCIUM 40 MG PO TABS
40.0000 mg | ORAL_TABLET | Freq: Every day | ORAL | Status: DC
  Administered 2018-06-13 – 2018-06-17 (×4): 40 mg via ORAL
  Filled 2018-06-13 (×5): qty 1

## 2018-06-13 MED ORDER — SODIUM CHLORIDE 0.9 % IV SOLN: 250.0000 mL | INTRAVENOUS | Status: DC | PRN

## 2018-06-13 MED ORDER — INSULIN ASPART 100 UNIT/ML ~~LOC~~ SOLN
0.0000 [IU] | Freq: Three times a day (TID) | SUBCUTANEOUS | Status: DC
  Administered 2018-06-14 – 2018-06-16 (×3): 2 [IU] via SUBCUTANEOUS
  Administered 2018-06-17: 1 [IU] via SUBCUTANEOUS
  Administered 2018-06-17: 2 [IU] via SUBCUTANEOUS
  Administered 2018-06-18: 1 [IU] via SUBCUTANEOUS

## 2018-06-13 MED ORDER — OXYCODONE HCL 5 MG PO TABS
5.0000 mg | ORAL_TABLET | Freq: Four times a day (QID) | ORAL | Status: DC | PRN
  Administered 2018-06-14 – 2018-06-15 (×3): 5 mg via ORAL
  Filled 2018-06-13 (×3): qty 1

## 2018-06-13 MED ORDER — MORPHINE SULFATE (PF) 2 MG/ML IV SOLN
2.0000 mg | Freq: Four times a day (QID) | INTRAVENOUS | Status: DC | PRN
  Filled 2018-06-13: qty 1

## 2018-06-13 MED ORDER — SODIUM CHLORIDE 0.9% FLUSH
3.0000 mL | Freq: Two times a day (BID) | INTRAVENOUS | Status: DC
  Administered 2018-06-13 – 2018-06-16 (×5): 3 mL via INTRAVENOUS

## 2018-06-13 MED ORDER — ONDANSETRON HCL 4 MG PO TABS: 4.0000 mg | ORAL_TABLET | Freq: Four times a day (QID) | ORAL | Status: DC | PRN

## 2018-06-13 NOTE — ED Notes (Signed)
Pt. Stated she makes little to no urine.

## 2018-06-13 NOTE — ED Provider Notes (Signed)
  Physical Exam  BP (!) 136/56 (BP Location: Right Arm)   Pulse (!) 109   Resp 17   Wt 49.5 kg   SpO2 98%   BMI 19.96 kg/m   Physical Exam  ED Course/Procedures     Procedures  MDM  Received patient in signout.  Dialysis patient with hypoglycemic episode and fall.  Scapular fracture and for rib fractures.  Feel patient benefit from admission to hospital.  Discussed with Dr. Donne Hazel from trauma surgery.  Thinks will just need pain control and follow-up x-ray tomorrow.  However with all comorbidities will do a medicine admission.       Davonna Belling, MD 06/13/18 [phone]

## 2018-06-13 NOTE — ED Notes (Signed)
CBG 153 

## 2018-06-13 NOTE — H&P (Signed)
[facility] History and Physical  Mary Aguirre AUQ:[id number] DOB: 1927-04-21 DOA: 06/13/2018  PCP: Drake Leach, MD  Patient coming from: Home  Chief Complaint: Left shoulder pain  HPI: Mary Aguirre is a 85 y.o. female with a medical history of diabetes, ESRD, hypertension, who presented to the emergency department after falling this morning approximately 3 times.  Patient remembers hitting her shoulder on the toilet.  She denies losing consciousness or hitting her head.  EMS was called, upon arrival patient was found to have a blood sugar of 43, and she was given dextrose.  Patient received her last dose of insulin the evening of 06/12/2018. She denies any chest pain, shortness of breath, abdominal pain, nausea or vomiting, diarrhea or constipation, dizziness or headache, recent illness or travel. Her daughter tells me she has dementia.  She only complains of having left shoulder pain at this time.  ED Course: Patient found to have scapular fracture along with rib fractures, trauma surgery consulted.  TRH called for admission given patient's multiple comorbidities.  Review of Systems:  All other systems reviewed and are negative.   Past Medical History:  Diagnosis Date  . Diabetes mellitus without complication ([place])     History reviewed. No pertinent surgical history.  Social History:  reports that she has never smoked. She has never used smokeless tobacco. She reports that she does not drink alcohol or use drugs.   Allergies  Allergen Reactions  . Cinacalcet Other (See Comments)  . Ramipril Other (See Comments)    unknown  . Iohexol      Desc: hives;pt pre-medicated with 13 hr prep did fine-02/15/05     Family History  Problem Relation Age of Onset  . Colon cancer Brother   . Stroke Neg Hx     Prior to Admission medications   Medication Sig Start Date End Date Taking? Authorizing Provider  amLODipine (NORVASC) 5 MG tablet Take 5 mg by mouth daily. 02/15/18  Yes  [provider]  atorvastatin (LIPITOR) 40 MG tablet Take 40 mg by mouth at bedtime. 01/23/18  Yes [provider]  bumetanide (BUMEX) 2 MG tablet Take 2 mg by mouth daily. 01/23/18  Yes [provider]    Physical Exam: Vitals:   06/13/18 1615 06/13/18 1715  BP: (!) 136/56 (!) 148/75  Pulse: (!) 109   Resp: 17 16  SpO2: 98%      General: Well developed, well nourished, NAD, appears stated age  HEENT: NCAT, PERRLA, EOMI, Anicteic Sclera, mucous membranes moist.   Neck: Supple, no JVD, no masses  Cardiovascular: S1 S2 auscultated, no rubs, murmurs or gallops. Regular rate and rhythm.  Respiratory: Clear to auscultation bilaterally with equal chest rise  Abdomen: Soft, Left sided TTP, nondistended, + bowel sounds  Extremities: warm dry without cyanosis clubbing or edema  Neuro: AAOx3, cranial nerves grossly intact. Strength 5/5 in patient's upper and lower extremities bilaterally  Skin: Without rashes exudates or nodules  Psych: Normal affect and demeanor with intact judgement and insight  Labs on Admission: I have personally reviewed following labs and imaging studies CBC: Recent Labs  Lab 06/13/18 1007  WBC 8.9  NEUTROABS 7.7  HGB 9.4*  HCT 29.2*  MCV 95.1  PLT 638   Basic Metabolic Panel: Recent Labs  Lab 06/13/18 1007  NA 138  K 4.7  CL 101  CO2 24  GLUCOSE 94  BUN 19  CREATININE 3.87*  CALCIUM 8.7*   GFR: Estimated Creatinine Clearance: 7.7 mL/min (  A) (by C-G formula based on SCr of 3.87 mg/dL (H)). Liver Function Tests: Recent Labs  Lab 06/13/18 1007  AST 22  ALT 13  ALKPHOS 333*  BILITOT 0.3  PROT 7.0  ALBUMIN 3.6   Recent Labs  Lab 06/13/18 1007  LIPASE 78*   No results for input(s): AMMONIA in the last 168 hours. Coagulation Profile: No results for input(s): INR, PROTIME in the last 168 hours. Cardiac Enzymes: No results for input(s): CKTOTAL, CKMB, CKMBINDEX, TROPONINI in the last 168 hours. BNP (last 3  results) No results for input(s): PROBNP in the last 8760 hours. HbA1C: No results for input(s): HGBA1C in the last 72 hours. CBG: Recent Labs  Lab 06/13/18 0952 06/13/18 1521  GLUCAP 85 153*   Lipid Profile: No results for input(s): CHOL, HDL, LDLCALC, TRIG, CHOLHDL, LDLDIRECT in the last 72 hours. Thyroid Function Tests: No results for input(s): TSH, T4TOTAL, FREET4, T3FREE, THYROIDAB in the last 72 hours. Anemia Panel: No results for input(s): VITAMINB12, FOLATE, FERRITIN, TIBC, IRON, RETICCTPCT in the last 72 hours. Urine analysis:    Component Value Date/Time   COLORURINE STRAW (A) 06/13/2018 1540   APPEARANCEUR CLEAR 06/13/2018 1540   LABSPEC 1.006 06/13/2018 1540   PHURINE 9.0 (H) 06/13/2018 1540   GLUCOSEU 50 (A) 06/13/2018 1540   HGBUR NEGATIVE 06/13/2018 1540   BILIRUBINUR NEGATIVE 06/13/2018 1540   KETONESUR NEGATIVE 06/13/2018 1540   PROTEINUR 100 (A) 06/13/2018 1540   NITRITE NEGATIVE 06/13/2018 1540   LEUKOCYTESUR NEGATIVE 06/13/2018 1540   Sepsis Labs: @LABRCNTIP (procalcitonin:4,lacticidven:4) )No results found for this or any previous visit (from the past 240 hour(s)).   Radiological Exams on Admission: Dg Chest 1 View  Result Date: 06/13/2018 CLINICAL DATA:  Shoulder pain. EXAM: CHEST  1 VIEW COMPARISON:  04/02/2018 FINDINGS: Stable cardiac silhouette. Central venous line unchanged. No effusion, infiltrate pneumothorax. There is a linear lucency along the medial border of the LEFT scapular body incompletely imaged. IMPRESSION: Concern for LEFT scapular fracture. Recommend CT LEFT shoulder or at minimum LEFT shoulder radiographs three views. Electronically Signed   By: Suzy Bouchard M.D.   On: 06/13/2018 11:32   Dg Elbow Complete Left  Result Date: 06/13/2018 CLINICAL DATA:  Multiple falls this morning. Left arm pain. Initial encounter. EXAM: LEFT ELBOW - COMPLETE 3+ VIEW COMPARISON:  None. FINDINGS: There is no evidence of fracture, dislocation, or joint  effusion. Osteopenic appearance. Irregular appearance of the olecranon process, likely enthesopathy. The patient has end-stage renal disease and there is acromioclavicular joint widening and irregularity. The overlying soft tissues are not clearly thickened when allowing for skin folds. IMPRESSION: 1. No acute fracture. 2. Irregular appearance of the olecranon process, favor renal osteodystrophy. Electronically Signed   By: Monte Fantasia M.D.   On: 06/13/2018 11:21   Ct Chest Wo Contrast  Result Date: 06/13/2018 CLINICAL DATA:  85 year old female with multiple falls. Left shoulder pain, scapula fracture. EXAM: CT CHEST WITHOUT CONTRAST TECHNIQUE: Multidetector CT imaging of the chest was performed following the standard protocol without IV contrast. COMPARISON:  Chest and shoulder radiographs from earlier today. Thoracic spine radiographs 01/27/2018. Chest CT 02/15/2005. FINDINGS: Cardiovascular: Right IJ approach dual lumen dialysis type catheter. Extensive Calcified aortic atherosclerosis. Calcified coronary artery atherosclerosis. Vascular patency is not evaluated in the absence of IV contrast. Mild cardiomegaly. Mild pericardial effusion is new since 2006. Mediastinum/Nodes: No mediastinal lymphadenopathy. Lungs/Pleura: Trace bilateral pleural fluid. No pneumothorax. Major airways are clear. Mild centrilobular emphysema is chronic and stable since 2006. Minimal to mild  bilateral pulmonary atelectasis. Upper Abdomen: Partially visible and enlarged chronic left adrenal mass which was 31 millimeters diameter in 2006 and now at least 41 millimeters. This has become partially calcified (series 3, image 153). Otherwise negative visible noncontrast upper abdominal viscera. Musculoskeletal: Comminuted mildly displaced fracture of the body of the left scapula (sagittal image 81). The visible left clavicle and humerus remain intact. Visible right shoulder osseous structures are intact. Nondisplaced left  anterolateral 4th through 7th rib fractures are suspected (series 4, image 72). No displaced rib fracture identified. An inferior endplate fracture of T8 appears stable since September. A superior endplate deformity of T9 seems new since then but has a nonacute appearance now. Mild T10 inferior endplate deformity appears chronic and stable. IMPRESSION: 1. Comminuted mildly displaced fracture of the body of the left scapula. Suspect nondisplaced fractures of the left 4th through 7th ribs. 2. Mild cardiomegaly and pericardial effusion. Trace bilateral pleural fluid. 3. T9 superior endplate compression fracture is new since 2006 but has a non-acute appearance. 4. Slowly enlarging left adrenal mass since 2006 is of indeterminate histology, but unlikely malignant given the prolonged time course. Electronically Signed   By: Genevie Ann M.D.   On: 06/13/2018 16:18   Dg Shoulder Left  Result Date: 06/13/2018 CLINICAL DATA:  Fall this morning with left shoulder pain EXAM: LEFT SHOULDER - 2+ VIEW COMPARISON:  None. FINDINGS: Comminuted scapular body fracture with apex dorsal angulation towards the inferior angle. Acromioclavicular widening and prominent osteopenia, suspect renal osteodystrophy. Cardiopulmonary findings as described on dedicated chest x-ray. IMPRESSION: Comminuted and displaced scapular body fracture. Electronically Signed   By: Monte Fantasia M.D.   On: 06/13/2018 11:23   Dg Humerus Left  Result Date: 06/13/2018 CLINICAL DATA:  Fall this morning with left shoulder and upper arm pain. EXAM: LEFT HUMERUS - 2+ VIEW COMPARISON:  None. FINDINGS: Left scapular body fracture better visualized on dedicated shoulder study. Cortical deformity of the proximal humeral shaft, likely from old trauma. No acute humeral fracture. Osteopenia and mild widening/erosion at the acromioclavicular joint-likely renal osteodystrophy. IMPRESSION: Displaced scapular body fracture, reference dedicated shoulder series. Electronically  Signed   By: Monte Fantasia M.D.   On: 06/13/2018 11:22    EKG: Independently reviewed.  Sinus tachycardia, rate 112, prolonged QT 514ms  Assessment/Plan Fall with scapular fracture -Suspect secondary to hypoglycemia -CT chest showed comminuted mildly displaced fracture of the body of the left scapula.  Suspected nondisplaced fractures of the left fourth through seventh ribs.  T9 superior endplate compression fracture new since 2006 however has nonacute appearance. -Orthopedic surgery consulted, CT scan ordered.  Hoping to manage nonoperatively however patient may need sling for comfort.  Pressure will need to follow-up with Dr. Griffin Basil in 2 to 3 weeks. -Continue pain control -PT consulted  Diabetes mellitus, type II complicated by hypoglycemia  -Patient takes Levemir 6 units nightly, last dose was 06/12/2018 evening -Patient was found a blood sugar in the 40s upon arrival of EMS, she was given D50 -Hold Levemir, placed on insulin sliding scale with CBG monitoring -Last hemoglobin A1c was 5.6 04/19/2018  Essential hypertension -Continue amlodipine, hold Bumex  ESRD -Patient dialyzes Tuesday, Thursday, Saturday -Will make nephrology aware of patient's admission  Adrenal mass -CT noted left adrenal mass which is slowly enlarging since 2006 -Continue outpatient follow-up  DVT prophylaxis: heparin  Code Status: Full  Family Communication: Daughter at bedside. Admission, patients condition and plan of care including tests being ordered have been discussed with the patient and  daughter who indicate understanding and agree with the plan and Code Status.  Disposition Plan: Home   Consults called: Nephrology   Admission status: Observation.   Time spent: 70 minutes  Hayslee Casebolt D.O. [facility]  Between 7am to 7pm - Please see pager noted on [email or web address]  After 7pm go to [email or web address] 06/13/2018, 6:02 PM

## 2018-06-13 NOTE — ED Triage Notes (Signed)
Pt in from home via [facility], pt fell numerous times today, does not take blood thinners, denies hitting head, denies LOC, pt c/o L shoulder pain, no obvious deformity, pt alert, hx of dementia, CBG reported by the daughter as 80 with eating breakfast and receiving insulin with CBG 93 with EMS, HR 110

## 2018-06-13 NOTE — ED Notes (Signed)
Patient transported to X-ray 

## 2018-06-13 NOTE — Progress Notes (Signed)
Mary Nielsen. Aguirre was admitted to 5w25 from the ED via stretcher.  The patient was transferred from stretcher to bed without incident.  The patient is alert and oriented x4 and endorses significant shoulder pain with movement.  Skin assessment conducted by Edsel Petrin., RN and Kennon Holter., RN.  Bed is in the lowest position, bed alarm is activated.  Call bell and telephone are within reach.  Explained to patient how to use call bell and telephone, patient indicated understanding.  Patient denies any further needs at this time.  Will continue to monitor.

## 2018-06-13 NOTE — ED Provider Notes (Signed)
[facility] EMER[facility]ENC[facility] DEPARTMENT Provider Note   CSN: [id number] Arrival date & time: 06/13/18  4098     History   Chief Complaint Chief Complaint  Patient presents with  . Fall    HPI Mary Aguirre is a 85 y.o. female.  The history is provided by the patient, the EMS personnel and medical records. No language interpreter was used.  Fall  This is a new problem. The current episode started 3 to 5 hours ago. The problem occurs constantly. The problem has not changed since onset.Associated symptoms include chest pain. Pertinent negatives include no abdominal pain, no headaches and no shortness of breath. Nothing aggravates the symptoms. Nothing relieves the symptoms. She has tried nothing for the symptoms. The treatment provided no relief.    Past Medical History:  Diagnosis Date  . Diabetes mellitus without complication [facility])     Patient Active Problem List   Diagnosis Date Noted  . DM (diabetes mellitus), type 2 with complications ([place]) 11/91/4782  . ESRD (end stage renal disease) ([place]) 04/19/2018  . Essential hypertension 04/19/2018  . Pelvic fracture ([place]) 04/18/2018    History reviewed. No pertinent surgical history.   OB History   No obstetric history on file.      Home Medications    Prior to Admission medications   Medication Sig Start Date End Date Taking? Authorizing Provider  amLODipine (NORVASC) 5 MG tablet Take 5 mg by mouth daily. 02/15/18   [provider]  atorvastatin (LIPITOR) 40 MG tablet Take 40 mg by mouth at bedtime. 01/23/18   [provider]  bumetanide (BUMEX) 2 MG tablet Take 2 mg by mouth daily. 01/23/18   [provider]  HYDROcodone-acetaminophen (NORCO) 5-325 MG tablet Take 1 tablet by mouth every 6 (six) hours as needed for severe pain. 04/23/18   Domenic Polite, MD  Insulin Detemir (LEVEMIR FLEXTOUCH) 100 UNIT/ML Pen Inject 6 Units into the skin at bedtime. 04/23/18   Domenic Polite, MD     Family History Family History  Problem Relation Age of Onset  . Colon cancer Brother   . Stroke Neg Hx     Social History Social History   Tobacco Use  . Smoking status: Never Smoker  . Smokeless tobacco: Never Used  Substance Use Topics  . Alcohol use: Never    Frequency: Never  . Drug use: Never     Allergies   Cinacalcet; Ramipril; and Iohexol   Review of Systems Review of Systems  Constitutional: Negative for chills, diaphoresis, fatigue and fever.  HENT: Negative for congestion and rhinorrhea.   Eyes: Negative for visual disturbance.  Respiratory: Negative for cough, chest tightness, shortness of breath, wheezing and stridor.   Cardiovascular: Positive for chest pain. Negative for palpitations and leg swelling.  Gastrointestinal: Negative for abdominal pain, constipation, diarrhea, nausea and rectal pain.  Genitourinary: Negative for flank pain.  Musculoskeletal: Negative for back pain, neck pain and neck stiffness.  Skin: Negative for rash and wound.  Neurological: Negative for dizziness, light-headedness and headaches.  Psychiatric/Behavioral: Negative for agitation.  All other systems reviewed and are negative.    Physical Exam Updated Vital Signs SpO2 99%   Physical Exam Vitals signs and nursing note reviewed.  Constitutional:      General: She is not in acute distress.    Appearance: She is well-developed. She is not ill-appearing, toxic-appearing or diaphoretic.  HENT:     Head: Normocephalic and atraumatic.     Nose: No congestion or  rhinorrhea.     Mouth/Throat:     Pharynx: No oropharyngeal exudate or posterior oropharyngeal erythema.  Eyes:     Conjunctiva/sclera: Conjunctivae normal.     Pupils: Pupils are equal, round, and reactive to light.  Neck:     Musculoskeletal: Neck supple.  Cardiovascular:     Rate and Rhythm: Regular rhythm. Tachycardia present.     Heart sounds: No murmur.  Pulmonary:     Effort: Pulmonary effort is  normal. No respiratory distress.     Breath sounds: Normal breath sounds. No wheezing, rhonchi or rales.  Chest:     Chest wall: Tenderness present.    Abdominal:     General: There is no distension.     Palpations: Abdomen is soft.     Tenderness: There is no abdominal tenderness.  Musculoskeletal:        General: Tenderness present.     Left shoulder: She exhibits tenderness and pain.     Left upper arm: She exhibits tenderness. She exhibits no deformity and no laceration.       Arms:     Right lower leg: No edema.     Left lower leg: No edema.     Comments: Symmetric grip strength and radial pulses.  Normal sensation in arms.  Tenderness in the left elbow, left upper arm, and left shoulder.  Tenderness in the left chest.  Skin:    General: Skin is warm and dry.     Capillary Refill: Capillary refill takes less than 2 seconds.  Neurological:     General: No focal deficit present.     Mental Status: She is alert.     GCS: GCS eye subscore is 4. GCS verbal subscore is 5. GCS motor subscore is 6.  Psychiatric:        Mood and Affect: Mood normal.      ED Treatments / Results  Labs (all labs ordered are listed, but only abnormal results are displayed) Labs Reviewed  CBC WITH DIFFERENTIAL/PLATELET - Abnormal; Notable for the following components:      Result Value   RBC 3.07 (*)    Hemoglobin 9.4 (*)    HCT 29.2 (*)    All other components within normal limits  COMPREHENSIVE METABOLIC PANEL - Abnormal; Notable for the following components:   Creatinine, Ser 3.87 (*)    Calcium 8.7 (*)    Alkaline Phosphatase 333 (*)    GFR calc non Af Amer 10 (*)    GFR calc Af Amer 11 (*)    All other components within normal limits  LIPASE, BLOOD - Abnormal; Notable for the following components:   Lipase 78 (*)    All other components within normal limits  URINALYSIS, ROUTINE W REFLEX MICROSCOPIC - Abnormal; Notable for the following components:   Color, Urine STRAW (*)    pH 9.0  (*)    Glucose, UA 50 (*)    Protein, ur 100 (*)    All other components within normal limits  CBG MONITORING, ED - Abnormal; Notable for the following components:   Glucose-Capillary 153 (*)    All other components within normal limits  URINE CULTURE  CBG MONITORING, ED    EKG EKG Interpretation  Date/Time:  Wednesday June 13 2018 09:59:54 EST Ventricular Rate:  112 PR Interval:    QRS Duration: 97 QT Interval:  376 QTC Calculation: 514 R Axis:   63 Text Interpretation:  Sinus tachycardia Probable left atrial enlargement Anterior infarct, old Borderline  repol abnormality, diffuse leads Prolonged QT interval Baseline wander in lead(s) V3 V5 No prior ECg ror comparison.  No STEMI Confirmed by Antony Blackbird [phone]) on 06/13/2018 10:17:43 AM Also confirmed by Antony Blackbird [phone]), editor Philomena Doheny [phone])  on 06/13/2018 2:17:23 PM   Radiology Dg Chest 1 View  Result Date: 06/13/2018 CLINICAL DATA:  Shoulder pain. EXAM: CHEST  1 VIEW COMPARISON:  04/02/2018 FINDINGS: Stable cardiac silhouette. Central venous line unchanged. No effusion, infiltrate pneumothorax. There is a linear lucency along the medial border of the LEFT scapular body incompletely imaged. IMPRESSION: Concern for LEFT scapular fracture. Recommend CT LEFT shoulder or at minimum LEFT shoulder radiographs three views. Electronically Signed   By: Suzy Bouchard M.D.   On: 06/13/2018 11:32   Dg Elbow Complete Left  Result Date: 06/13/2018 CLINICAL DATA:  Multiple falls this morning. Left arm pain. Initial encounter. EXAM: LEFT ELBOW - COMPLETE 3+ VIEW COMPARISON:  None. FINDINGS: There is no evidence of fracture, dislocation, or joint effusion. Osteopenic appearance. Irregular appearance of the olecranon process, likely enthesopathy. The patient has end-stage renal disease and there is acromioclavicular joint widening and irregularity. The overlying soft tissues are not clearly thickened when allowing for skin folds.  IMPRESSION: 1. No acute fracture. 2. Irregular appearance of the olecranon process, favor renal osteodystrophy. Electronically Signed   By: Monte Fantasia M.D.   On: 06/13/2018 11:21   Ct Chest Wo Contrast  Result Date: 06/13/2018 CLINICAL DATA:  85 year old female with multiple falls. Left shoulder pain, scapula fracture. EXAM: CT CHEST WITHOUT CONTRAST TECHNIQUE: Multidetector CT imaging of the chest was performed following the standard protocol without IV contrast. COMPARISON:  Chest and shoulder radiographs from earlier today. Thoracic spine radiographs 01/27/2018. Chest CT 02/15/2005. FINDINGS: Cardiovascular: Right IJ approach dual lumen dialysis type catheter. Extensive Calcified aortic atherosclerosis. Calcified coronary artery atherosclerosis. Vascular patency is not evaluated in the absence of IV contrast. Mild cardiomegaly. Mild pericardial effusion is new since 2006. Mediastinum/Nodes: No mediastinal lymphadenopathy. Lungs/Pleura: Trace bilateral pleural fluid. No pneumothorax. Major airways are clear. Mild centrilobular emphysema is chronic and stable since 2006. Minimal to mild bilateral pulmonary atelectasis. Upper Abdomen: Partially visible and enlarged chronic left adrenal mass which was 31 millimeters diameter in 2006 and now at least 41 millimeters. This has become partially calcified (series 3, image 153). Otherwise negative visible noncontrast upper abdominal viscera. Musculoskeletal: Comminuted mildly displaced fracture of the body of the left scapula (sagittal image 81). The visible left clavicle and humerus remain intact. Visible right shoulder osseous structures are intact. Nondisplaced left anterolateral 4th through 7th rib fractures are suspected (series 4, image 72). No displaced rib fracture identified. An inferior endplate fracture of T8 appears stable since September. A superior endplate deformity of T9 seems new since then but has a nonacute appearance now. Mild T10 inferior  endplate deformity appears chronic and stable. IMPRESSION: 1. Comminuted mildly displaced fracture of the body of the left scapula. Suspect nondisplaced fractures of the left 4th through 7th ribs. 2. Mild cardiomegaly and pericardial effusion. Trace bilateral pleural fluid. 3. T9 superior endplate compression fracture is new since 2006 but has a non-acute appearance. 4. Slowly enlarging left adrenal mass since 2006 is of indeterminate histology, but unlikely malignant given the prolonged time course. Electronically Signed   By: Genevie Ann M.D.   On: 06/13/2018 16:18   Dg Shoulder Left  Result Date: 06/13/2018 CLINICAL DATA:  Fall this morning with left shoulder pain EXAM: LEFT SHOULDER - 2+ VIEW COMPARISON:  None. FINDINGS: Comminuted scapular body fracture with apex dorsal angulation towards the inferior angle. Acromioclavicular widening and prominent osteopenia, suspect renal osteodystrophy. Cardiopulmonary findings as described on dedicated chest x-ray. IMPRESSION: Comminuted and displaced scapular body fracture. Electronically Signed   By: Monte Fantasia M.D.   On: 06/13/2018 11:23   Dg Humerus Left  Result Date: 06/13/2018 CLINICAL DATA:  Fall this morning with left shoulder and upper arm pain. EXAM: LEFT HUMERUS - 2+ VIEW COMPARISON:  None. FINDINGS: Left scapular body fracture better visualized on dedicated shoulder study. Cortical deformity of the proximal humeral shaft, likely from old trauma. No acute humeral fracture. Osteopenia and mild widening/erosion at the acromioclavicular joint-likely renal osteodystrophy. IMPRESSION: Displaced scapular body fracture, reference dedicated shoulder series. Electronically Signed   By: Monte Fantasia M.D.   On: 06/13/2018 11:22   Ct No Charge  Result Date: 06/13/2018 CLINICAL DATA:  Shoulder pain after falling. Left scapular fracture on chest CT. EXAM: CT OF THE UPPER LEFT EXTREMITY WITHOUT CONTRAST TECHNIQUE: Multidetector reformatted CT images of the left  shoulder were generated from the chest CT performed earlier. COMPARISON:  Radiographs same date. FINDINGS: Bones/Joint/Cartilage Again demonstrated is a comminuted and moderately displaced fracture of the left scapular body. There is no involvement of the glenoid. The coracoid process and acromion are intact. The humeral head is located. No evidence of proximal left humeral fracture. No evidence of chest wall or left clavicle injury. There are underlying sternoclavicular acromioclavicular degenerative changes. Ligaments Suboptimally assessed by CT. Muscles and Tendons Soft tissue swelling around the scapula involving the subscapularis, infraspinatus and teres minor muscles. No focal hematoma identified. Soft tissues Surgical clips in the left axilla from previous axillary node dissection. No adenopathy or focal hematoma identified. IMPRESSION: 1. Comminuted and moderately displaced fracture of the left scapular body. No involvement of glenoid, coracoid process or acromion. 2. The proximal humerus is intact and located. Electronically Signed   By: Richardean Sale M.D.   On: 06/13/2018 18:33    Procedures Procedures (including critical care time)  Medications Ordered in ED Medications  fentaNYL (SUBLIMAZE) injection 50 mcg (50 mcg Intravenous Given 06/13/18 1033)  fentaNYL (SUBLIMAZE) injection 50 mcg (50 mcg Intravenous Given 06/13/18 1302)     Initial Impression / Assessment and Plan / ED Course  I have reviewed the triage vital signs and the nursing notes.  Pertinent labs & imaging results that were available during my care of the patient were reviewed by me and considered in my medical decision making (see chart for details).     Mary Aguirre is a 85 y.o. female with a past medical history significant for diabetes, ESRD on dialysis, hypertension, who presents for multiple falls and left sided pain.  Patient reports that she is doing well until this morning when she had 3 falls in the bathroom.   Patient denies hitting her head and denies loss of conscious.  She reports she is on blood thinners.  She reports pain in her left chest, left shoulder, left upper arm and elbow.  She is right-handed.  She denies any numbness, tingling, weakness but it hurts to move any of her left arm.  She denies any shortness of breath.  She denies vision changes, nausea, vomiting.  She reports has not missed any dialysis treatments.  According to EMS, patient's glucose was 43 at home but improved after eating breakfast and insulin.  Patient slightly tachycardic during transport however patient is in severe pain.  Patient denies other complaints including  no recent urinary symptoms as she does make urine.  She denies any constipation or diarrhea.  No recent medication changes or other traumas.  On exam, patient has severe tenderness in her left chest, left shoulder, left humerus, and left elbow.  Symmetric grip strength.  Normal radial pulses bilaterally.  Normal sensation in extremities.  Legs were nontender and nonedematous.  Abdomen was nontender.  Breath sounds were equal bilaterally.  No evidence of trauma to her head or neck, no neck tenderness.  Clinical I suspect patient may have had some symptoms of her hypoglycemia leading to her falls.  She reports that she is unsure why she fell multiple times.  She does not not have any report of recent falls before today.  Patient all imaging of her chest, left shoulder, left arm.  Suspect traumatic fractures.  Anticipate reassessment for work-up.  She will have screen laboratory testing as well given the hypoglycemia to look for occult infection and will be given pain medicine.  Anticipate reassessment.  Patient's x-rays show comminuted left scapular fracture.  Orthopedics was called who agreed with the radiology recommendations for CT imaging.  Given the left chest tenderness despite negative x-ray, patient on CT of the chest and shoulder to look for occult rib fractures  and to further evaluate the shoulder fracture.  Anticipate touching base with orthopedics for further management recommendations.  Care transferred to Dr. Alvino Chapel while awaiting CT results.   Final Clinical Impressions(s) / ED Diagnoses   Final diagnoses:  Fall, initial encounter  Hypoglycemia  Closed displaced fracture of body of left scapula, initial encounter  Closed fracture of multiple ribs of left side, initial encounter    ED Discharge Orders    None      Clinical Impression: 1. Fall, initial encounter   2. Fall   3. Hypoglycemia   4. Closed displaced fracture of body of left scapula, initial encounter   5. Closed fracture of multiple ribs of left side, initial encounter   6. Fx     Disposition: Admit  This note was prepared with assistance of Dragon voice recognition software. Occasional wrong-word or sound-a-like substitutions may have occurred due to the inherent limitations of voice recognition software.      , Gwenyth Allegra, MD 06/13/18 [phone]

## 2018-06-13 NOTE — Consult Note (Signed)
Reason for Consult:Left scapula fx Referring Physician: C Tegeler  Mary Aguirre is an 85 y.o. female.  HPI: Mary Aguirre fell multiple times in the bathroom this morning. She hurt her left shoulder in the first fall and the pain kept her from getting up successfully and she fell a couple more times trying. She was brought to the hospital where x-rays showed a scapula fx and orthopedic surgery was consulted.  Past Medical History:  Diagnosis Date  . Diabetes mellitus without complication ([place])     History reviewed. No pertinent surgical history.  Family History  Problem Relation Age of Onset  . Colon cancer Brother   . Stroke Neg Hx     Social History:  reports that she has never smoked. She has never used smokeless tobacco. She reports that she does not drink alcohol or use drugs.  Allergies:  Allergies  Allergen Reactions  . Cinacalcet Other (See Comments)  . Ramipril Other (See Comments)    unknown  . Iohexol      Desc: hives;pt pre-medicated with 13 hr prep did fine-02/15/05     Medications: I have reviewed the patient's current medications.  Results for orders placed or performed during the hospital encounter of 06/13/18 (from the past 48 hour(s))  CBG monitoring, ED     Status: None   Collection Time: 06/13/18  9:52 AM  Result Value Ref Range   Glucose-Capillary 85 70 - 99 mg/dL   Comment 1 Notify RN    Comment 2 Document in Chart   CBC with Differential     Status: Abnormal   Collection Time: 06/13/18 10:07 AM  Result Value Ref Range   WBC 8.9 4.0 - 10.5 K/uL   RBC 3.07 (L) 3.87 - 5.11 MIL/uL   Hemoglobin 9.4 (L) 12.0 - 15.0 g/dL   HCT 29.2 (L) 36.0 - 46.0 %   MCV 95.1 80.0 - 100.0 fL   MCH 30.6 26.0 - 34.0 pg   MCHC 32.2 30.0 - 36.0 g/dL   RDW 15.4 11.5 - 15.5 %   Platelets 289 150 - 400 K/uL   nRBC 0.0 0.0 - 0.2 %   Neutrophils Relative % 85 %   Neutro Abs 7.7 1.7 - 7.7 K/uL   Lymphocytes Relative 10 %   Lymphs Abs 0.9 0.7 - 4.0 K/uL   Monocytes Relative 4 %    Monocytes Absolute 0.3 0.1 - 1.0 K/uL   Eosinophils Relative 0 %   Eosinophils Absolute 0.0 0.0 - 0.5 K/uL   Basophils Relative 0 %   Basophils Absolute 0.0 0.0 - 0.1 K/uL   Immature Granulocytes 1 %   Abs Immature Granulocytes 0.05 0.00 - 0.07 K/uL    Comment: Performed at [facility], [address], [place], [place] [address]  Comprehensive metabolic panel     Status: Abnormal   Collection Time: 06/13/18 10:07 AM  Result Value Ref Range   Sodium 138 135 - 145 mmol/L   Potassium 4.7 3.5 - 5.1 mmol/L   Chloride 101 98 - 111 mmol/L   CO2 24 22 - 32 mmol/L   Glucose, Bld 94 70 - 99 mg/dL   BUN 19 8 - 23 mg/dL   Creatinine, Ser 3.87 (H) 0.44 - 1.00 mg/dL   Calcium 8.7 (L) 8.9 - 10.3 mg/dL   Total Protein 7.0 6.5 - 8.1 g/dL   Albumin 3.6 3.5 - 5.0 g/dL   AST 22 15 - 41 U/L   ALT 13 0 - 44 U/L  Alkaline Phosphatase 333 (H) 38 - 126 U/L   Total Bilirubin 0.3 0.3 - 1.2 mg/dL   GFR calc non Af Amer 10 (L) >60 mL/min   GFR calc Af Amer 11 (L) >60 mL/min   Anion gap 13 5 - 15    Comment: Performed at [facility] [address] [place], [place] [address]  Lipase, blood     Status: Abnormal   Collection Time: 06/13/18 10:07 AM  Result Value Ref Range   Lipase 78 (H) 11 - 51 U/L    Comment: Performed at [facility], [place] [address], [place], [place] [address]  CBG monitoring, ED     Status: Abnormal   Collection Time: 06/13/18  3:21 PM  Result Value Ref Range   Glucose-Capillary 153 (H) 70 - 99 mg/dL   Comment 1 Notify RN    Comment 2 Document in Chart     Dg Chest 1 View  Result Date: 06/13/2018 CLINICAL DATA:  Shoulder pain. EXAM: CHEST  1 VIEW COMPARISON:  04/02/2018 FINDINGS: Stable cardiac silhouette. Central venous line unchanged. No effusion, infiltrate pneumothorax. There is a linear lucency along the medial border of the LEFT scapular body incompletely imaged. IMPRESSION: Concern for LEFT scapular fracture. Recommend CT LEFT shoulder or at minimum  LEFT shoulder radiographs three views. Electronically Signed   By: Suzy Bouchard M.D.   On: 06/13/2018 11:32   Dg Elbow Complete Left  Result Date: 06/13/2018 CLINICAL DATA:  Multiple falls this morning. Left arm pain. Initial encounter. EXAM: LEFT ELBOW - COMPLETE 3+ VIEW COMPARISON:  None. FINDINGS: There is no evidence of fracture, dislocation, or joint effusion. Osteopenic appearance. Irregular appearance of the olecranon process, likely enthesopathy. The patient has end-stage renal disease and there is acromioclavicular joint widening and irregularity. The overlying soft tissues are not clearly thickened when allowing for skin folds. IMPRESSION: 1. No acute fracture. 2. Irregular appearance of the olecranon process, favor renal osteodystrophy. Electronically Signed   By: Monte Fantasia M.D.   On: 06/13/2018 11:21   Dg Shoulder Left  Result Date: 06/13/2018 CLINICAL DATA:  Fall this morning with left shoulder pain EXAM: LEFT SHOULDER - 2+ VIEW COMPARISON:  None. FINDINGS: Comminuted scapular body fracture with apex dorsal angulation towards the inferior angle. Acromioclavicular widening and prominent osteopenia, suspect renal osteodystrophy. Cardiopulmonary findings as described on dedicated chest x-ray. IMPRESSION: Comminuted and displaced scapular body fracture. Electronically Signed   By: Monte Fantasia M.D.   On: 06/13/2018 11:23   Dg Humerus Left  Result Date: 06/13/2018 CLINICAL DATA:  Fall this morning with left shoulder and upper arm pain. EXAM: LEFT HUMERUS - 2+ VIEW COMPARISON:  None. FINDINGS: Left scapular body fracture better visualized on dedicated shoulder study. Cortical deformity of the proximal humeral shaft, likely from old trauma. No acute humeral fracture. Osteopenia and mild widening/erosion at the acromioclavicular joint-likely renal osteodystrophy. IMPRESSION: Displaced scapular body fracture, reference dedicated shoulder series. Electronically Signed   By: Monte Fantasia M.D.   On: 06/13/2018 11:22    Review of Systems  Constitutional: Negative for weight loss.  HENT: Negative for ear discharge, ear pain, hearing loss and tinnitus.   Eyes: Negative for blurred vision, double vision, photophobia and pain.  Respiratory: Negative for cough, sputum production and shortness of breath.   Cardiovascular: Negative for chest pain.  Gastrointestinal: Negative for abdominal pain, nausea and vomiting.  Genitourinary: Negative for dysuria, flank pain, frequency and urgency.  Musculoskeletal: Positive for joint pain (Left  shoulder). Negative for back pain, falls, myalgias and neck pain.  Neurological: Negative for dizziness, tingling, sensory change, focal weakness, loss of consciousness and headaches.  Endo/Heme/Allergies: Does not bruise/bleed easily.  Psychiatric/Behavioral: Negative for depression, memory loss and substance abuse. The patient is not nervous/anxious.    Blood pressure (!) 159/68, pulse (!) 116, resp. rate 19, weight 49.5 kg, SpO2 99 %. Physical Exam  Constitutional: She appears well-developed and well-nourished. No distress.  HENT:  Head: Normocephalic and atraumatic.  Eyes: Conjunctivae are normal. Right eye exhibits no discharge. Left eye exhibits no discharge. No scleral icterus.  Neck: Normal range of motion.  Cardiovascular: Normal rate and regular rhythm.  Respiratory: Effort normal. No respiratory distress.  Musculoskeletal:     Comments: Left shoulder, elbow, wrist, digits- no skin wounds, severe TTP, esp posteriorly, no instability, no blocks to motion that I could detect though she would barely let me range her.  Sens  Ax/R/M/U intact  Mot   Ax/ R/ PIN/ M/ AIN/ U intact  Rad 2+  Neurological: She is alert.  Skin: Skin is warm and dry. She is not diaphoretic.  Psychiatric: She has a normal mood and affect. Her behavior is normal.    Assessment/Plan: Left scapula fx -- CT pending. As long as fx does not extend into glenoid we  will manage this non-operatively. She may WBAT but may need a sling for comfort. I have encouraged daily motion to help prevent stiffness. She should f/u with Dr. Griffin Basil in 2-3 weeks.    Lisette Abu, PA-C Orthopedic Surgery [phone] 06/13/2018, 3:42 PM

## 2018-06-14 DIAGNOSIS — E162 Hypoglycemia, unspecified: Secondary | ICD-10-CM | POA: Diagnosis not present

## 2018-06-14 DIAGNOSIS — I10 Essential (primary) hypertension: Secondary | ICD-10-CM | POA: Diagnosis not present

## 2018-06-14 DIAGNOSIS — E118 Type 2 diabetes mellitus with unspecified complications: Secondary | ICD-10-CM | POA: Diagnosis not present

## 2018-06-14 DIAGNOSIS — N186 End stage renal disease: Secondary | ICD-10-CM | POA: Diagnosis not present

## 2018-06-14 LAB — BASIC METABOLIC PANEL
Anion gap: 11 (ref 5–15)
BUN: 26 mg/dL — ABNORMAL HIGH (ref 8–23)
CO2: 22 mmol/L (ref 22–32)
Calcium: 7.9 mg/dL — ABNORMAL LOW (ref 8.9–10.3)
Chloride: 104 mmol/L (ref 98–111)
Creatinine, Ser: 4.87 mg/dL — ABNORMAL HIGH (ref 0.44–1.00)
GFR calc Af Amer: 9 mL/min — ABNORMAL LOW (ref >60)
GFR calc non Af Amer: 7 mL/min — ABNORMAL LOW (ref >60)
Glucose, Bld: 86 mg/dL (ref 70–99)
Potassium: 4.5 mmol/L (ref 3.5–5.1)
Sodium: 137 mmol/L (ref 135–145)

## 2018-06-14 LAB — URINE CULTURE

## 2018-06-14 LAB — CBC
HCT: 25.2 % — ABNORMAL LOW (ref 36.0–46.0)
Hemoglobin: 8.4 g/dL — ABNORMAL LOW (ref 12.0–15.0)
MCH: 30.7 pg (ref 26.0–34.0)
MCHC: 33.3 g/dL (ref 30.0–36.0)
MCV: 92 fL (ref 80.0–100.0)
Platelets: 269 10*3/uL (ref 150–400)
RBC: 2.74 MIL/uL — ABNORMAL LOW (ref 3.87–5.11)
RDW: 15.4 % (ref 11.5–15.5)
WBC: 8.7 10*3/uL (ref 4.0–10.5)
nRBC: 0 % (ref 0.0–0.2)

## 2018-06-14 LAB — GLUCOSE, CAPILLARY
Glucose-Capillary: 76 mg/dL (ref 70–99)
Glucose-Capillary: 121 mg/dL — ABNORMAL HIGH (ref 70–99)
Glucose-Capillary: 162 mg/dL — ABNORMAL HIGH (ref 70–99)

## 2018-06-14 LAB — MRSA PCR SCREENING: MRSA by PCR: NEGATIVE

## 2018-06-14 MED ORDER — CHLORHEXIDINE GLUCONATE CLOTH 2 % EX PADS
6.0000 | MEDICATED_PAD | Freq: Every day | CUTANEOUS | Status: DC
  Administered 2018-06-14 – 2018-06-18 (×3): 6 via TOPICAL

## 2018-06-14 MED ORDER — HEPARIN SODIUM (PORCINE) 1000 UNIT/ML IJ SOLN
INTRAMUSCULAR | Status: AC
  Administered 2018-06-14: 3300 [IU]
  Filled 2018-06-14: qty 4

## 2018-06-14 NOTE — Evaluation (Addendum)
Physical Therapy Evaluation Patient Details Name: Mary Aguirre MRN: [id number] DOB: May 26, 1926 Today's Date: 06/14/2018   History of Present Illness  85 y.o. female admitted with fall, CBG 43, sustained L scapular fracture and L 4-7th rib fractures. PMH of dementia, DM, HTN, ESRD, L inferior/superior pubic rami fractures November 2019.   Clinical Impression  Pt admitted with above diagnosis. Pt currently with functional limitations due to the deficits listed below (see PT Problem List). Pt adamantly refused to attempt supine to sit 2* severe L shoulder pain. PT/OT provided much encouragement to participate, and explained negative effects of bed rest, pt still refused mobility. Pain medication requested. BP 180/84, RN notified.  Will likely need ST-SNF. Pt will benefit from skilled PT to increase their independence and safety with mobility to allow discharge to the venue listed below.       Follow Up Recommendations SNF;Supervision/Assistance - 24 hour;Supervision for mobility/OOB    Equipment Recommendations  Other (comment)(TBD at SNF, may need WC if DC home)    Recommendations for Other Services       Precautions / Restrictions Precautions Precautions: Fall: sling LUE for comfort (no sling in room, RN notified) Precaution Comments: per chart, fall with pelvic fracture November 2019 Restrictions Weight Bearing Restrictions: No      Mobility  Bed Mobility               General bed mobility comments: VCs for set up for log roll, pt then refused to attempt  Transfers                 General transfer comment: pt refused  Ambulation/Gait                Stairs            Wheelchair Mobility    Modified Rankin (Stroke Patients Only)       Balance                                             Pertinent Vitals/Pain Pain Assessment: Faces Faces Pain Scale: Hurts worst Pain Location: L shoulder Pain Descriptors / Indicators:  Grimacing;Guarding Pain Intervention(s): Limited activity within patient's tolerance;Monitored during session;Patient requesting pain meds-RN notified(pt refused ice/repositioning)    Home Living Family/patient expects to be discharged to:: Skilled nursing facility Living Arrangements: Children                    Prior Function Level of Independence: Needs assistance   Gait / Transfers Assistance Needed: Uses RW for ambulation at home. Per chart, multiple falls reported.   ADL's / Homemaking Assistance Needed: daughter assisted with LB ADLs since last fall (per prior OT note November 2019)  Comments: pt vague historian, above info gleaned from prior PT/OT notes     Hand Dominance   Dominant Hand: Right    Extremity/Trunk Assessment   Upper Extremity Assessment Upper Extremity Assessment: Defer to OT evaluation    Lower Extremity Assessment Lower Extremity Assessment: Generalized weakness(sensation intact to light touch B feet, BLE AROM appears grossly WNL (can perform heel slides and B SLR independently), limited assessment 2* pt refusal to attempt OOB)       Communication   Communication: No difficulties  Cognition Arousal/Alertness: Awake/alert Behavior During Therapy: Agitated Overall Cognitive Status: No family/caregiver present to determine baseline cognitive functioning  General Comments: oriented to self and location/situation; vague historian, can follow commands, adamantly refused to attempt mobility      General Comments General comments (skin integrity, edema, etc.): BP 180/84 (RN notified), SaO2 100% on room air, HR 108 at rest in supine    Exercises     Assessment/Plan    PT Assessment Patient needs continued PT services  PT Problem List Decreased mobility;Decreased activity tolerance;Pain;Decreased cognition       PT Treatment Interventions DME instruction;Gait training;Functional mobility  training;Therapeutic activities;Therapeutic exercise;Balance training;Patient/family education    PT Goals (Current goals can be found in the Care Plan section)  Acute Rehab PT Goals Patient Stated Goal: decrease pain PT Goal Formulation: Patient unable to participate in goal setting Time For Goal Achievement: 06/28/18 Potential to Achieve Goals: Fair    Frequency Min 3X/week   Barriers to discharge        Co-evaluation PT/OT/SLP Co-Evaluation/Treatment: Yes Reason for Co-Treatment: Necessary to address cognition/behavior during functional activity;For patient/therapist safety;To address functional/ADL transfers PT goals addressed during session: Mobility/safety with mobility;Strengthening/ROM         AM-PAC PT "6 Clicks" Mobility  Outcome Measure Help needed turning from your back to your side while in a flat bed without using bedrails?: Total Help needed moving from lying on your back to sitting on the side of a flat bed without using bedrails?: Total Help needed moving to and from a bed to a chair (including a wheelchair)?: Total Help needed standing up from a chair using your arms (e.g., wheelchair or bedside chair)?: Total Help needed to walk in hospital room?: Total Help needed climbing 3-5 steps with a railing? : Total 6 Click Score: 6    End of Session   Activity Tolerance: Patient limited by pain Patient left: in bed;with bed alarm set;with call bell/phone within reach Nurse Communication: Mobility status;Patient requests pain meds PT Visit Diagnosis: History of falling (Z91.81);Repeated falls (R29.6);Difficulty in walking, not elsewhere classified (R26.2);Other abnormalities of gait and mobility (R26.89);Pain Pain - Right/Left: Left Pain - part of body: Shoulder    Time: 1030-1051 PT Time Calculation (min) (ACUTE ONLY): 21 min   Charges:   PT Evaluation $PT Eval Moderate Complexity: 1 Mod         Philomena Doheny PT 06/14/2018  Acute  Rehabilitation Services Pager [phone] Office [phone]

## 2018-06-14 NOTE — Evaluation (Signed)
Occupational Therapy Evaluation Patient Details Name: Mary Aguirre MRN: [id number] DOB: 1926-12-11 Today's Date: 06/14/2018    History of Present Illness 85 y.o. female admitted with fall, CBG 43, sustained L scapular fracture and L 4-7th rib fractures. PMH of dementia, DM, HTN, ESRD, L inferior/superior pubic rami fractures November 2019.    Clinical Impression   Pt with decline in function and safety with ADLs and ADL mobility with decreased strength, balance, endurance and hx of cognitive impairments (dementia). P limited by pain and adamantly refused to attempt supine to sit due to severe L shoulder pain. Therapists provided max encouragement to participate, and explained negative effects of bed rest, pt still refused mobility. Pain medication requested. BP 180/84, RN notified. Pt would benefit from acute OT services to address impairments to maximize level of safety and function  Follow Up Recommendations  SNF;Supervision/Assistance - 24 hour    Equipment Recommendations  Other (comment)(TBD at next venue of care)    Recommendations for Other Services       Precautions / Restrictions Precautions Precautions: Fall Precaution Comments: per chart, fall with pelvic fracture November 2019 Required Braces or Orthoses: Sling Restrictions Weight Bearing Restrictions: No      Mobility Bed Mobility               General bed mobility comments: VCs for set up for log roll, pt then refused to attempt  Transfers                 General transfer comment: pt refused    Balance                                           ADL either performed or assessed with clinical judgement   ADL Overall ADL's : Needs assistance/impaired Eating/Feeding: Minimal assistance;Sitting Eating/Feeding Details (indicate cue type and reason): assist for set up, opening containers Grooming: Wash/dry hands;Wash/dry face;Minimal assistance;Sitting;Bed level   Upper Body  Bathing: Maximal assistance;Bed level   Lower Body Bathing: Total assistance   Upper Body Dressing : Maximal assistance;Bed level   Lower Body Dressing: Total assistance;Bed level     Toilet Transfer Details (indicate cue type and reason): pt refused   Toileting - Clothing Manipulation Details (indicate cue type and reason): pt refused       General ADL Comments: pt adamantly refused mobility     Vision Baseline Vision/History: Wears glasses Wears Glasses: At all times Patient Visual Report: No change from baseline       Perception     Praxis      Pertinent Vitals/Pain Pain Assessment: Faces Faces Pain Scale: Hurts worst Pain Location: L shoulder Pain Descriptors / Indicators: Grimacing;Guarding Pain Intervention(s): Limited activity within patient's tolerance;Monitored during session;Patient requesting pain meds-RN notified     Hand Dominance Right   Extremity/Trunk Assessment Upper Extremity Assessment Upper Extremity Assessment: Generalized weakness;LUE deficits/detail LUE: Unable to fully assess due to pain   Lower Extremity Assessment Lower Extremity Assessment: Defer to PT evaluation       Communication Communication Communication: No difficulties   Cognition Arousal/Alertness: Awake/alert Behavior During Therapy: Agitated Overall Cognitive Status: No family/caregiver present to determine baseline cognitive functioning                                 General Comments: oriented to self  and location/situation; vague historian, can follow commands, adamantly refused to attempt mobility   General Comments  BP 180/84, O2 SATs 100% on RA, HR 108 at rest supine in bed    Exercises     Shoulder Instructions      Home Living Family/patient expects to be discharged to:: Skilled nursing facility Living Arrangements: Children                                      Prior Functioning/Environment Level of Independence: Needs  assistance  Gait / Transfers Assistance Needed: Uses RW for ambulation at home. Per chart, multiple falls reported.  ADL's / Homemaking Assistance Needed: daughter assisted with LB ADLs since last fall (per prior OT note November 2019)   Comments: pt vague historian, above info gleaned from prior PT/OT notes        OT Problem List: Decreased strength;Decreased activity tolerance;Decreased cognition;Decreased knowledge of use of DME or AE;Decreased range of motion;Impaired balance (sitting and/or standing);Decreased coordination;Pain      OT Treatment/Interventions: Self-care/ADL training;Therapeutic exercise;DME and/or AE instruction;Therapeutic activities;Patient/family education    OT Goals(Current goals can be found in the care plan section) Acute Rehab OT Goals Patient Stated Goal: decrease pain OT Goal Formulation: With patient Time For Goal Achievement: 06/28/18 Potential to Achieve Goals: Good ADL Goals Pt Will Perform Grooming: with min guard assist;sitting Pt Will Perform Upper Body Bathing: with mod assist;sitting Pt Will Perform Lower Body Bathing: with max assist;with mod assist;sitting/lateral leans Pt Will Transfer to Toilet: with mod assist;stand pivot transfer;bedside commode Additional ADL Goal #1: Pt will complete bed mobiliyt with mod A to sit EOB in prep for grooming and ADL tasks  OT Frequency: Min 2X/week   Barriers to D/C: Decreased caregiver support          Co-evaluation PT/OT/SLP Co-Evaluation/Treatment: Yes Reason for Co-Treatment: Necessary to address cognition/behavior during functional activity;For patient/therapist safety;To address functional/ADL transfers PT goals addressed during session: Mobility/safety with mobility;Strengthening/ROM OT goals addressed during session: ADL's and self-care      AM-PAC OT "6 Clicks" Daily Activity     Outcome Measure Help from another person eating meals?: A Little Help from another person taking care of  personal grooming?: A Little Help from another person toileting, which includes using toliet, bedpan, or urinal?: Total Help from another person bathing (including washing, rinsing, drying)?: Total Help from another person to put on and taking off regular upper body clothing?: A Lot Help from another person to put on and taking off regular lower body clothing?: Total 6 Click Score: 11   End of Session    Activity Tolerance: Patient limited by pain Patient left: in bed;with call bell/phone within reach;with bed alarm set  OT Visit Diagnosis: History of falling (Z91.81);Repeated falls (R29.6);Other abnormalities of gait and mobility (R26.89);Muscle weakness (generalized) (M62.81);Pain;Other symptoms and signs involving cognitive function Pain - Right/Left: Left Pain - part of body: Shoulder                Time: 2706-2376 OT Time Calculation (min): 22 min Charges:  OT General Charges $OT Visit: 1 Visit OT Evaluation $OT Eval Moderate Complexity: 1 Mod    Britt Bottom 06/14/2018, 1:34 PM

## 2018-06-14 NOTE — Progress Notes (Signed)
Chaplain met this sweet lady who shattered a shoulder in a fall.  She is concerned about it-has pain but due to age not many options.  She requested prayer.  Prayer for her to have peace of heart and mind and for relief from her pain and discomfort.  Prayers for wisdom for all her caregivers.  Conard Novak, Chaplain   06/14/18 1000  Clinical Encounter Type  Visited With Patient  Visit Type Initial;Spiritual support  Referral From Patient  Consult/Referral To Chaplain  Spiritual Encounters  Spiritual Needs Prayer  Stress Factors  Patient Stress Factors Other (Comment) (fall and shattered shoulder -in pain)

## 2018-06-14 NOTE — Consult Note (Signed)
Reason for Consult: Continuity of ESRD care Referring Physician: Cristal Ford, DO [facility])  HPI:  85 year old African-American woman with past medical history significant for hypertension, diabetes mellitus and end-stage renal disease on hemodialysis on a TTS schedule at [facility].  Yesterday, suffered multiple falls likely secondary to hypoglycemia and hit her left shoulder.  Imaging shows scapular fracture for which she has been admitted to the hospital for additional management.  She denies any chest pain, shortness of breath, fever, chills, nausea, vomiting or diarrhea.  She has significant difficulty with memory and does not know any pertinent details regarding her dialysis treatments.  Past Medical History:  Diagnosis Date  . Diabetes mellitus without complication ([place])     History reviewed. No pertinent surgical history.  Family History  Problem Relation Age of Onset  . Colon cancer Brother   . Stroke Neg Hx     Social History:  reports that she has never smoked. She has never used smokeless tobacco. She reports that she does not drink alcohol or use drugs.  Allergies:  Allergies  Allergen Reactions  . Cinacalcet Other (See Comments)  . Ramipril Other (See Comments)    unknown  . Iohexol      Desc: hives;pt pre-medicated with 13 hr prep did fine-02/15/05     Medications:  Scheduled: . amLODipine  5 mg Oral Daily  . atorvastatin  40 mg Oral QHS  . Chlorhexidine Gluconate Cloth  6 each Topical Q0600  . feeding supplement (NEPRO CARB STEADY)  237 mL Oral BID BM  . heparin  5,000 Units Subcutaneous Q8H  . insulin aspart  0-9 Units Subcutaneous TID WC  . sodium chloride flush  3 mL Intravenous Q12H    BMP Latest Ref Rng & Units 06/14/2018 06/13/2018 04/22/2018  Glucose 70 - 99 mg/dL 86 94 79  BUN 8 - 23 mg/dL 26(H) 19 31(H)  Creatinine 0.44 - 1.00 mg/dL 4.87(H) 3.87(H) 3.98(H)  Sodium 135 - 145 mmol/L 137 138 137  Potassium 3.5 - 5.1 mmol/L 4.5 4.7 4.8  Chloride 98 -  111 mmol/L 104 101 100  CO2 22 - 32 mmol/L 22 24 26   Calcium 8.9 - 10.3 mg/dL 7.9(L) 8.7(L) 9.4   CBC Latest Ref Rng & Units 06/14/2018 06/13/2018 04/22/2018  WBC 4.0 - 10.5 K/uL 8.7 8.9 9.0  Hemoglobin 12.0 - 15.0 g/dL 8.4(L) 9.4(L) 9.0(L)  Hematocrit 36.0 - 46.0 % 25.2(L) 29.2(L) 28.1(L)  Platelets 150 - 400 K/uL 269 289 355     Dg Chest 1 View  Result Date: 06/13/2018 CLINICAL DATA:  Shoulder pain. EXAM: CHEST  1 VIEW COMPARISON:  04/02/2018 FINDINGS: Stable cardiac silhouette. Central venous line unchanged. No effusion, infiltrate pneumothorax. There is a linear lucency along the medial border of the LEFT scapular body incompletely imaged. IMPRESSION: Concern for LEFT scapular fracture. Recommend CT LEFT shoulder or at minimum LEFT shoulder radiographs three views. Electronically Signed   By: Suzy Bouchard M.D.   On: 06/13/2018 11:32   Dg Elbow Complete Left  Result Date: 06/13/2018 CLINICAL DATA:  Multiple falls this morning. Left arm pain. Initial encounter. EXAM: LEFT ELBOW - COMPLETE 3+ VIEW COMPARISON:  None. FINDINGS: There is no evidence of fracture, dislocation, or joint effusion. Osteopenic appearance. Irregular appearance of the olecranon process, likely enthesopathy. The patient has end-stage renal disease and there is acromioclavicular joint widening and irregularity. The overlying soft tissues are not clearly thickened when allowing for skin folds. IMPRESSION: 1. No acute fracture. 2. Irregular appearance of the olecranon  process, favor renal osteodystrophy. Electronically Signed   By: Monte Fantasia M.D.   On: 06/13/2018 11:21   Ct Chest Wo Contrast  Result Date: 06/13/2018 CLINICAL DATA:  85 year old female with multiple falls. Left shoulder pain, scapula fracture. EXAM: CT CHEST WITHOUT CONTRAST TECHNIQUE: Multidetector CT imaging of the chest was performed following the standard protocol without IV contrast. COMPARISON:  Chest and shoulder radiographs from earlier today.  Thoracic spine radiographs 01/27/2018. Chest CT 02/15/2005. FINDINGS: Cardiovascular: Right IJ approach dual lumen dialysis type catheter. Extensive Calcified aortic atherosclerosis. Calcified coronary artery atherosclerosis. Vascular patency is not evaluated in the absence of IV contrast. Mild cardiomegaly. Mild pericardial effusion is new since 2006. Mediastinum/Nodes: No mediastinal lymphadenopathy. Lungs/Pleura: Trace bilateral pleural fluid. No pneumothorax. Major airways are clear. Mild centrilobular emphysema is chronic and stable since 2006. Minimal to mild bilateral pulmonary atelectasis. Upper Abdomen: Partially visible and enlarged chronic left adrenal mass which was 31 millimeters diameter in 2006 and now at least 41 millimeters. This has become partially calcified (series 3, image 153). Otherwise negative visible noncontrast upper abdominal viscera. Musculoskeletal: Comminuted mildly displaced fracture of the body of the left scapula (sagittal image 81). The visible left clavicle and humerus remain intact. Visible right shoulder osseous structures are intact. Nondisplaced left anterolateral 4th through 7th rib fractures are suspected (series 4, image 72). No displaced rib fracture identified. An inferior endplate fracture of T8 appears stable since September. A superior endplate deformity of T9 seems new since then but has a nonacute appearance now. Mild T10 inferior endplate deformity appears chronic and stable. IMPRESSION: 1. Comminuted mildly displaced fracture of the body of the left scapula. Suspect nondisplaced fractures of the left 4th through 7th ribs. 2. Mild cardiomegaly and pericardial effusion. Trace bilateral pleural fluid. 3. T9 superior endplate compression fracture is new since 2006 but has a non-acute appearance. 4. Slowly enlarging left adrenal mass since 2006 is of indeterminate histology, but unlikely malignant given the prolonged time course. Electronically Signed   By: Genevie Ann M.D.    On: 06/13/2018 16:18   Dg Shoulder Left  Result Date: 06/13/2018 CLINICAL DATA:  Fall this morning with left shoulder pain EXAM: LEFT SHOULDER - 2+ VIEW COMPARISON:  None. FINDINGS: Comminuted scapular body fracture with apex dorsal angulation towards the inferior angle. Acromioclavicular widening and prominent osteopenia, suspect renal osteodystrophy. Cardiopulmonary findings as described on dedicated chest x-ray. IMPRESSION: Comminuted and displaced scapular body fracture. Electronically Signed   By: Monte Fantasia M.D.   On: 06/13/2018 11:23   Dg Humerus Left  Result Date: 06/13/2018 CLINICAL DATA:  Fall this morning with left shoulder and upper arm pain. EXAM: LEFT HUMERUS - 2+ VIEW COMPARISON:  None. FINDINGS: Left scapular body fracture better visualized on dedicated shoulder study. Cortical deformity of the proximal humeral shaft, likely from old trauma. No acute humeral fracture. Osteopenia and mild widening/erosion at the acromioclavicular joint-likely renal osteodystrophy. IMPRESSION: Displaced scapular body fracture, reference dedicated shoulder series. Electronically Signed   By: Monte Fantasia M.D.   On: 06/13/2018 11:22   Ct No Charge  Result Date: 06/13/2018 CLINICAL DATA:  Shoulder pain after falling. Left scapular fracture on chest CT. EXAM: CT OF THE UPPER LEFT EXTREMITY WITHOUT CONTRAST TECHNIQUE: Multidetector reformatted CT images of the left shoulder were generated from the chest CT performed earlier. COMPARISON:  Radiographs same date. FINDINGS: Bones/Joint/Cartilage Again demonstrated is a comminuted and moderately displaced fracture of the left scapular body. There is no involvement of the glenoid. The coracoid process  and acromion are intact. The humeral head is located. No evidence of proximal left humeral fracture. No evidence of chest wall or left clavicle injury. There are underlying sternoclavicular acromioclavicular degenerative changes. Ligaments Suboptimally assessed  by CT. Muscles and Tendons Soft tissue swelling around the scapula involving the subscapularis, infraspinatus and teres minor muscles. No focal hematoma identified. Soft tissues Surgical clips in the left axilla from previous axillary node dissection. No adenopathy or focal hematoma identified. IMPRESSION: 1. Comminuted and moderately displaced fracture of the left scapular body. No involvement of glenoid, coracoid process or acromion. 2. The proximal humerus is intact and located. Electronically Signed   By: Richardean Sale M.D.   On: 06/13/2018 18:33    Review of Systems  Constitutional: Positive for malaise/fatigue. Negative for chills and fever.  HENT: Negative.   Eyes: Negative.   Respiratory: Negative.   Cardiovascular: Negative.   Gastrointestinal: Negative.   Genitourinary: Negative.   Musculoskeletal: Positive for joint pain.       See HPI-left shoulder pain  Skin: Negative.   Endo/Heme/Allergies:       Recent hypoglycemia   Blood pressure (!) 149/74, pulse (!) 101, temperature 98.9 F (37.2 C), resp. rate 18, height 5\' 2"  (1.575 m), weight 53.8 kg, SpO2 100 %. Physical Exam  Vitals reviewed. Constitutional: She is oriented to person, place, and time. She appears well-developed and well-nourished. No distress.  HENT:  Head: Normocephalic and atraumatic.  Mouth/Throat: Oropharynx is clear and moist.  Eyes: Pupils are equal, round, and reactive to light. Conjunctivae and EOM are normal. No scleral icterus.  Neck: Normal range of motion. Neck supple. No JVD present.  Cardiovascular: Normal rate, regular rhythm and normal heart sounds.  No murmur heard. Respiratory: Effort normal and breath sounds normal. She has no wheezes. She has no rales.  Right IJ TDC  GI: Soft. Bowel sounds are normal. There is no abdominal tenderness. There is no rebound and no guarding.  Musculoskeletal:        General: No edema.  Neurological: She is alert and oriented to person, place, and time.   Skin: Skin is warm and dry. No rash noted.    Assessment/Plan: 1.  Left scapular fracture: Evaluated by orthopedic surgery and plans noted for management based on involvement of glenoid or not. 2.  End-stage renal disease: We will order for hemodialysis to continue TTS schedule.  Contact her outpatient dialysis unit for additional details regarding her dialysis. 3.  Hypertension: Resumed oral antihypertensive therapy and monitor with hemodialysis. 4.  Anemia of chronic kidney disease: Been/hematocrit noted, monitor trend and resume ESA. 5.  Metabolic bone disease: We will continue to trend calcium/phosphorus levels and obtain records regarding binder/VDRA dosing.  Myda Detwiler K. 06/14/2018, 11:56 AM

## 2018-06-14 NOTE — Progress Notes (Signed)
PROGRESS NOTE    [name]  VOJ:[id number] DOB: [birth year] DOA: 06/13/2018 PCP: Drake Leach, MD   Brief Narrative:  HPI on 06/13/2018 Mary Aguirre is a 85 y.o. female with a medical history of diabetes, ESRD, hypertension, who presented to the emergency department after falling this morning approximately 3 times.  Patient remembers hitting her shoulder on the toilet.  She denies losing consciousness or hitting her head.  EMS was called, upon arrival patient was found to have a blood sugar of 43, and she was given dextrose.  Patient received her last dose of insulin the evening of 06/12/2018. She denies any chest pain, shortness of breath, abdominal pain, nausea or vomiting, diarrhea or constipation, dizziness or headache, recent illness or travel. Her daughter tells me she has dementia.  She only complains of having left shoulder pain at this time.  Assessment & Plan   Fall with scapular fracture -Suspect secondary to hypoglycemia -CT chest showed comminuted mildly displaced fracture of the body of the left scapula.  Suspected nondisplaced fractures of the left fourth through seventh ribs.  T9 superior endplate compression fracture new since 2006 however has nonacute appearance. -Orthopedic surgery consulted, CT scan ordered.  Hoping to manage nonoperatively however patient may need sling for comfort.  Pressure will need to follow-up with Dr. Griffin Basil in 2 to 3 weeks. -CT showed comminuted and moderately displaced fracture of the left scapular body.  No involvement of the glenoid, coracoid process or acromion.  Proximal humerus is intact and located. -Continue pain control -PT consulted and recommended SNF.  Pending OT consult -Social work consulted  Diabetes mellitus, type II complicated by hypoglycemia  -Patient takes Levemir 6 units nightly, last dose was 06/12/2018 evening -Patient was found a blood sugar in the 40s upon arrival of EMS, she was given D50 -Hold Levemir, placed on  insulin sliding scale with CBG monitoring -Last hemoglobin A1c was 5.6 04/19/2018 -Given that hemoglobin A1c is 5.6, and blood sugars have been low to normal, do not feel patient should be discharged with any insulin.  Essential hypertension -Continue amlodipine, hold Bumex  ESRD -Patient dialyzes Tuesday, Thursday, Saturday -Nephrology consulted and appreciated  Adrenal mass -CT noted left adrenal mass which is slowly enlarging since 2006 -Continue outpatient follow-up  DVT Prophylaxis  Heparin  Code Status: Full  Family Communication: None at bedside  Disposition Plan: Observation. Pending SNF placement.   Consultants Orthopedic surgery Nephrology  Procedures  None  Antibiotics   Anti-infectives (From admission, onward)   None      Subjective:   Mary Aguirre seen and examined today.  Continues to complain of left sided pain especially when she moves her left arm or takes a deep breath.  Denies current chest pain, shortness of breath, abdominal pain, nausea or vomiting, diarrhea or constipation, dizziness or headache. Objective:   Vitals:   06/14/18 0525 06/14/18 0905 06/14/18 1058 06/14/18 1105  BP: (!) 164/75 (!) 188/77 (!) 180/84 (!) 149/74  Pulse: (!) 113 (!) 110 (!) 108 (!) 101  Resp: 18     Temp: 98.9 F (37.2 C)     SpO2: 100%  100%   Weight:      Height:        Intake/Output Summary (Last 24 hours) at 06/14/2018 1209 Last data filed at 06/14/2018 0211 Gross per 24 hour  Intake 3 ml  Output 675 ml  Net -672 ml   Filed Weights   06/13/18 1029 06/13/18 2012 06/14/18 0212  Weight: 49.5  kg 53 kg 53.8 kg    Exam  General: Well developed, elderly, NAD  HEENT: NCAT, mucous membranes moist.   Neck: Supple  Cardiovascular: S1 S2 auscultated, RRR, no murmur  Respiratory: Clear to auscultation bilaterally   Abdomen: Soft, left sided TTP, nondistended, + bowel sounds  Extremities: warm dry without cyanosis clubbing or edema  Neuro: AAOx3,  nonfocal  Psych: Pleasant, appropriate mood and affect   Data Reviewed: I have personally reviewed following labs and imaging studies  CBC: Recent Labs  Lab 06/13/18 1007 06/14/18 0355  WBC 8.9 8.7  NEUTROABS 7.7  --   HGB 9.4* 8.4*  HCT 29.2* 25.2*  MCV 95.1 92.0  PLT 289 245   Basic Metabolic Panel: Recent Labs  Lab 06/13/18 1007 06/14/18 0355  NA 138 137  K 4.7 4.5  CL 101 104  CO2 24 22  GLUCOSE 94 86  BUN 19 26*  CREATININE 3.87* 4.87*  CALCIUM 8.7* 7.9*   GFR: Estimated Creatinine Clearance: 6.2 mL/min (A) (by C-G formula based on SCr of 4.87 mg/dL (H)). Liver Function Tests: Recent Labs  Lab 06/13/18 1007  AST 22  ALT 13  ALKPHOS 333*  BILITOT 0.3  PROT 7.0  ALBUMIN 3.6   Recent Labs  Lab 06/13/18 1007  LIPASE 78*   No results for input(s): AMMONIA in the last 168 hours. Coagulation Profile: No results for input(s): INR, PROTIME in the last 168 hours. Cardiac Enzymes: No results for input(s): CKTOTAL, CKMB, CKMBINDEX, TROPONINI in the last 168 hours. BNP (last 3 results) No results for input(s): PROBNP in the last 8760 hours. HbA1C: No results for input(s): HGBA1C in the last 72 hours. CBG: Recent Labs  Lab 06/13/18 1521 06/13/18 1935 06/13/18 2338 06/14/18 0754 06/14/18 1121  GLUCAP 153* 117* 137* 76 121*   Lipid Profile: No results for input(s): CHOL, HDL, LDLCALC, TRIG, CHOLHDL, LDLDIRECT in the last 72 hours. Thyroid Function Tests: No results for input(s): TSH, T4TOTAL, FREET4, T3FREE, THYROIDAB in the last 72 hours. Anemia Panel: No results for input(s): VITAMINB12, FOLATE, FERRITIN, TIBC, IRON, RETICCTPCT in the last 72 hours. Urine analysis:    Component Value Date/Time   COLORURINE STRAW (A) 06/13/2018 1540   APPEARANCEUR CLEAR 06/13/2018 1540   LABSPEC 1.006 06/13/2018 1540   PHURINE 9.0 (H) 06/13/2018 1540   GLUCOSEU 50 (A) 06/13/2018 1540   HGBUR NEGATIVE 06/13/2018 1540   BILIRUBINUR NEGATIVE 06/13/2018 1540    KETONESUR NEGATIVE 06/13/2018 1540   PROTEINUR 100 (A) 06/13/2018 1540   NITRITE NEGATIVE 06/13/2018 1540   LEUKOCYTESUR NEGATIVE 06/13/2018 1540   Sepsis Labs: @LABRCNTIP (procalcitonin:4,lacticidven:4)  ) Recent Results (from the past 240 hour(s))  MRSA PCR Screening     Status: None   Collection Time: 06/13/18  8:34 PM  Result Value Ref Range Status   MRSA by PCR NEGATIVE NEGATIVE Final    Comment:        The GeneXpert MRSA Assay (FDA approved for NASAL specimens only), is one component of a comprehensive MRSA colonization surveillance program. It is not intended to diagnose MRSA infection nor to guide or monitor treatment for MRSA infections. Performed at [facility], [place] [address], [place], [place] [address]       Radiology Studies: Dg Chest 1 View  Result Date: 06/13/2018 CLINICAL DATA:  Shoulder pain. EXAM: CHEST  1 VIEW COMPARISON:  04/02/2018 FINDINGS: Stable cardiac silhouette. Central venous line unchanged. No effusion, infiltrate pneumothorax. There is a linear lucency along the medial border of the  LEFT scapular body incompletely imaged. IMPRESSION: Concern for LEFT scapular fracture. Recommend CT LEFT shoulder or at minimum LEFT shoulder radiographs three views. Electronically Signed   By: Suzy Bouchard M.D.   On: 06/13/2018 11:32   Dg Elbow Complete Left  Result Date: 06/13/2018 CLINICAL DATA:  Multiple falls this morning. Left arm pain. Initial encounter. EXAM: LEFT ELBOW - COMPLETE 3+ VIEW COMPARISON:  None. FINDINGS: There is no evidence of fracture, dislocation, or joint effusion. Osteopenic appearance. Irregular appearance of the olecranon process, likely enthesopathy. The patient has end-stage renal disease and there is acromioclavicular joint widening and irregularity. The overlying soft tissues are not clearly thickened when allowing for skin folds. IMPRESSION: 1. No acute fracture. 2. Irregular appearance of the olecranon process, favor renal  osteodystrophy. Electronically Signed   By: Monte Fantasia M.D.   On: 06/13/2018 11:21   Ct Chest Wo Contrast  Result Date: 06/13/2018 CLINICAL DATA:  85 year old female with multiple falls. Left shoulder pain, scapula fracture. EXAM: CT CHEST WITHOUT CONTRAST TECHNIQUE: Multidetector CT imaging of the chest was performed following the standard protocol without IV contrast. COMPARISON:  Chest and shoulder radiographs from earlier today. Thoracic spine radiographs 01/27/2018. Chest CT 02/15/2005. FINDINGS: Cardiovascular: Right IJ approach dual lumen dialysis type catheter. Extensive Calcified aortic atherosclerosis. Calcified coronary artery atherosclerosis. Vascular patency is not evaluated in the absence of IV contrast. Mild cardiomegaly. Mild pericardial effusion is new since 2006. Mediastinum/Nodes: No mediastinal lymphadenopathy. Lungs/Pleura: Trace bilateral pleural fluid. No pneumothorax. Major airways are clear. Mild centrilobular emphysema is chronic and stable since 2006. Minimal to mild bilateral pulmonary atelectasis. Upper Abdomen: Partially visible and enlarged chronic left adrenal mass which was 31 millimeters diameter in 2006 and now at least 41 millimeters. This has become partially calcified (series 3, image 153). Otherwise negative visible noncontrast upper abdominal viscera. Musculoskeletal: Comminuted mildly displaced fracture of the body of the left scapula (sagittal image 81). The visible left clavicle and humerus remain intact. Visible right shoulder osseous structures are intact. Nondisplaced left anterolateral 4th through 7th rib fractures are suspected (series 4, image 72). No displaced rib fracture identified. An inferior endplate fracture of T8 appears stable since September. A superior endplate deformity of T9 seems new since then but has a nonacute appearance now. Mild T10 inferior endplate deformity appears chronic and stable. IMPRESSION: 1. Comminuted mildly displaced fracture  of the body of the left scapula. Suspect nondisplaced fractures of the left 4th through 7th ribs. 2. Mild cardiomegaly and pericardial effusion. Trace bilateral pleural fluid. 3. T9 superior endplate compression fracture is new since 2006 but has a non-acute appearance. 4. Slowly enlarging left adrenal mass since 2006 is of indeterminate histology, but unlikely malignant given the prolonged time course. Electronically Signed   By: Genevie Ann M.D.   On: 06/13/2018 16:18   Dg Shoulder Left  Result Date: 06/13/2018 CLINICAL DATA:  Fall this morning with left shoulder pain EXAM: LEFT SHOULDER - 2+ VIEW COMPARISON:  None. FINDINGS: Comminuted scapular body fracture with apex dorsal angulation towards the inferior angle. Acromioclavicular widening and prominent osteopenia, suspect renal osteodystrophy. Cardiopulmonary findings as described on dedicated chest x-ray. IMPRESSION: Comminuted and displaced scapular body fracture. Electronically Signed   By: Monte Fantasia M.D.   On: 06/13/2018 11:23   Dg Humerus Left  Result Date: 06/13/2018 CLINICAL DATA:  Fall this morning with left shoulder and upper arm pain. EXAM: LEFT HUMERUS - 2+ VIEW COMPARISON:  None. FINDINGS: Left scapular body fracture better visualized on dedicated shoulder  study. Cortical deformity of the proximal humeral shaft, likely from old trauma. No acute humeral fracture. Osteopenia and mild widening/erosion at the acromioclavicular joint-likely renal osteodystrophy. IMPRESSION: Displaced scapular body fracture, reference dedicated shoulder series. Electronically Signed   By: Monte Fantasia M.D.   On: 06/13/2018 11:22   Ct No Charge  Result Date: 06/13/2018 CLINICAL DATA:  Shoulder pain after falling. Left scapular fracture on chest CT. EXAM: CT OF THE UPPER LEFT EXTREMITY WITHOUT CONTRAST TECHNIQUE: Multidetector reformatted CT images of the left shoulder were generated from the chest CT performed earlier. COMPARISON:  Radiographs same date.  FINDINGS: Bones/Joint/Cartilage Again demonstrated is a comminuted and moderately displaced fracture of the left scapular body. There is no involvement of the glenoid. The coracoid process and acromion are intact. The humeral head is located. No evidence of proximal left humeral fracture. No evidence of chest wall or left clavicle injury. There are underlying sternoclavicular acromioclavicular degenerative changes. Ligaments Suboptimally assessed by CT. Muscles and Tendons Soft tissue swelling around the scapula involving the subscapularis, infraspinatus and teres minor muscles. No focal hematoma identified. Soft tissues Surgical clips in the left axilla from previous axillary node dissection. No adenopathy or focal hematoma identified. IMPRESSION: 1. Comminuted and moderately displaced fracture of the left scapular body. No involvement of glenoid, coracoid process or acromion. 2. The proximal humerus is intact and located. Electronically Signed   By: Richardean Sale M.D.   On: 06/13/2018 18:33     Scheduled Meds: . amLODipine  5 mg Oral Daily  . atorvastatin  40 mg Oral QHS  . Chlorhexidine Gluconate Cloth  6 each Topical Q0600  . feeding supplement (NEPRO CARB STEADY)  237 mL Oral BID BM  . heparin  5,000 Units Subcutaneous Q8H  . insulin aspart  0-9 Units Subcutaneous TID WC  . sodium chloride flush  3 mL Intravenous Q12H   Continuous Infusions: . sodium chloride       LOS: 0 days   Time Spent in minutes   30 minutes  Anakaren Campion D.O. on 06/14/2018 at 12:09 PM  Between 7am to 7pm - Please see pager noted on [email or web address]  After 7pm go to [email or web address]  And look for the night coverage person covering for me after hours  [facility] Office  [phone]

## 2018-06-14 NOTE — Progress Notes (Signed)
Initial Nutrition Assessment  DOCUMENTATION CODES:   Non-severe (moderate) malnutrition in context of chronic illness  INTERVENTION:   - Continue Nepro Shake po BID, each supplement provides 425 kcal and 19 grams protein  - Recommend renal MVI daily  NUTRITION DIAGNOSIS:   Moderate Malnutrition related to chronic illness (ERSD on HD) as evidenced by mild fat depletion, mild muscle depletion, moderate muscle depletion.  GOAL:   Patient will meet greater than or equal to 90% of their needs  MONITOR:   PO intake, Supplement acceptance, Labs, Weight trends, I & O's  REASON FOR ASSESSMENT:   Malnutrition Screening Tool    ASSESSMENT:   85 year old female who presented to the ED on 1/22 after multiple falls. PMH significant for ESRD on HD, T2DM, HTN. X-ray showing left scapular fracture and suspected rib fractures. Orthopedics consulted with recommendation to manage nonoperatively.  Spoke with pt at bedside who reports that she has  Good appetite and "eats what I want." Pt shares that she eats 3 meals daily but "doesn't eat that much."  Breakfast: bacon, eggs, grits Lunch: chicken or hamburger with creamed corn and cabbage Dinner: similar to lunch  Pt states that she has talked with the RD at her outpatient HD center who told her not to eat peanut butter. Pt shares that she takes crackers and something to drink to HD when she goes (T/T/S).  Pt ordered lunch, dinner, and breakfast meals at time of visit.  Pt shares that she has been losing weight even before starting HD. Pt states that she has not been on HD long, less than 1 year. Pt unsure of her UBW, states that she used to be a size 10 or 12. Weight history in chart is limited but indicates weight gain over the last 2 months.  Pt reports that she ambulates with a walker.  Medications reviewed and include: Nepro BID, SSI  Labs reviewed: hemoglobin 8.4 (L) CBG's:  76, 137, 117, 153 x 24 hours  UOP: 675 ml x 24  hours  NUTRITION - FOCUSED PHYSICAL EXAM:    Most Recent Value  Orbital Region  Mild depletion  Upper Arm Region  Mild depletion  Thoracic and Lumbar Region  No depletion  Buccal Region  Mild depletion  Temple Region  Mild depletion  Clavicle Bone Region  Mild depletion  Clavicle and Acromion Bone Region  Moderate depletion  Scapular Bone Region  Unable to assess  Dorsal Hand  No depletion  Patellar Region  Moderate depletion  Anterior Thigh Region  Moderate depletion  Posterior Calf Region  Moderate depletion  Edema (RD Assessment)  None  Hair  Reviewed  Eyes  Reviewed  Mouth  Reviewed  Skin  Reviewed  Nails  Reviewed       Diet Order:   Diet Order            Diet renal with fluid restriction Fluid restriction: 1200 mL Fluid; Room service appropriate? Yes; Fluid consistency: Thin  Diet effective now              EDUCATION NEEDS:   No education needs have been identified at this time  Skin:  Skin Assessment: Reviewed RN Assessment  Last BM:  1/23 (medium type 4)  Height:   Ht Readings from Last 1 Encounters:  06/13/18 5\' 2"  (1.575 m)    Weight:   Wt Readings from Last 1 Encounters:  06/14/18 53.8 kg    Ideal Body Weight:  50 kg  BMI:  Body mass  index is 21.69 kg/m.  Estimated Nutritional Needs:   Kcal:  1400-1600  Protein:  75-90 grams  Fluid:  UOP + 1000 ml    Gaynell Face, MS, RD, LDN Inpatient Clinical Dietitian Pager: [phone] Weekend/After Hours: [phone]

## 2018-06-15 DIAGNOSIS — N186 End stage renal disease: Secondary | ICD-10-CM | POA: Diagnosis not present

## 2018-06-15 DIAGNOSIS — E118 Type 2 diabetes mellitus with unspecified complications: Secondary | ICD-10-CM | POA: Diagnosis not present

## 2018-06-15 DIAGNOSIS — E162 Hypoglycemia, unspecified: Secondary | ICD-10-CM | POA: Diagnosis not present

## 2018-06-15 DIAGNOSIS — I10 Essential (primary) hypertension: Secondary | ICD-10-CM | POA: Diagnosis not present

## 2018-06-15 LAB — RENAL FUNCTION PANEL
Albumin: 2.9 g/dL — ABNORMAL LOW (ref 3.5–5.0)
Anion gap: 10 (ref 5–15)
BUN: 18 mg/dL (ref 8–23)
CO2: 25 mmol/L (ref 22–32)
Calcium: 8.9 mg/dL (ref 8.9–10.3)
Chloride: 100 mmol/L (ref 98–111)
Creatinine, Ser: 3.39 mg/dL — ABNORMAL HIGH (ref 0.44–1.00)
GFR calc Af Amer: 13 mL/min — ABNORMAL LOW (ref >60)
GFR calc non Af Amer: 11 mL/min — ABNORMAL LOW (ref >60)
Glucose, Bld: 116 mg/dL — ABNORMAL HIGH (ref 70–99)
Phosphorus: 4.8 mg/dL — ABNORMAL HIGH (ref 2.5–4.6)
Potassium: 4.1 mmol/L (ref 3.5–5.1)
Sodium: 135 mmol/L (ref 135–145)

## 2018-06-15 LAB — GLUCOSE, CAPILLARY
Glucose-Capillary: 166 mg/dL — ABNORMAL HIGH (ref 70–99)
Glucose-Capillary: 112 mg/dL — ABNORMAL HIGH (ref 70–99)
Glucose-Capillary: 135 mg/dL — ABNORMAL HIGH (ref 70–99)
Glucose-Capillary: 156 mg/dL — ABNORMAL HIGH (ref 70–99)
Glucose-Capillary: 169 mg/dL — ABNORMAL HIGH (ref 70–99)

## 2018-06-15 LAB — HEPATITIS B SURFACE ANTIGEN: Hepatitis B Surface Ag: NEGATIVE

## 2018-06-15 LAB — HEMOGLOBIN AND HEMATOCRIT, BLOOD
HCT: 26 % — ABNORMAL LOW (ref 36.0–46.0)
HEMOGLOBIN: 8.8 g/dL — AB (ref 12.0–15.0)

## 2018-06-15 MED ORDER — DARBEPOETIN ALFA 60 MCG/0.3ML IJ SOSY
60.0000 ug | PREFILLED_SYRINGE | INTRAMUSCULAR | Status: DC
  Administered 2018-06-16: 60 ug via INTRAVENOUS
  Filled 2018-06-15: qty 0.3

## 2018-06-15 NOTE — NC FL2 (Addendum)
[place] MEDICAID FL2 LEVEL OF CARE SCREENING TOOL     IDENTIFICATION  Patient Name: Mary Aguirre Birthdate: [birth year] Sex: female Admission Date (Current Location): 06/13/2018  [facility] and [place] Number:  Herbalist and Address:  [facility], [place] [address], [place], [place] [address]      Provider Number: [id number]  Attending Physician Name and Address:  Cristal Ford, DO  Relative Name and Phone Number:  Beckie Busing granddaughter [phone]    Current Level of Care: Hospital Recommended Level of Care: [facility] Prior Approval Number:    Date Approved/Denied:   PASRR Number: [id number]  Discharge Plan: SNF    Current Diagnoses: Patient Active Problem List   Diagnosis Date Noted  . Hypoglycemia 06/13/2018  . DM (diabetes mellitus), type 2 with complications ([place]) 21/19/4174  . ESRD (end stage renal disease) ([place]) 04/19/2018  . Essential hypertension 04/19/2018  . Pelvic fracture ([place]) 04/18/2018    Orientation RESPIRATION BLADDER Height & Weight     Self, Time, Situation, Place  Normal Continent Weight: 51.3 kg Height:  5\' 2"  (157.5 cm)  BEHAVIORAL SYMPTOMS/MOOD NEUROLOGICAL BOWEL NUTRITION STATUS      Continent Diet(Please see DC summary)  AMBULATORY STATUS COMMUNICATION OF NEEDS Skin   Limited Assist Verbally Normal                       Personal Care Assistance Level of Assistance  Bathing, Feeding, Dressing Bathing Assistance: Maximum assistance Feeding assistance: Independent Dressing Assistance: Limited assistance     Functional Limitations Info  Sight, Hearing, Speech Sight Info: Impaired Hearing Info: Impaired Speech Info: Adequate    SPECIAL CARE FACTORS FREQUENCY  PT (By licensed PT), OT (By licensed OT)     PT Frequency: 5x/week OT Frequency: 3x/week            Contractures Contractures Info: Not present    Additional Factors Info  Code Status, Allergies, Insulin  Sliding Scale Code Status Info: Full Allergies Info: Cinacalcet, Ramipril, Iohexol   Insulin Sliding Scale Info: 3x daily with meals       Current Medications (06/15/2018):  This is the current hospital active medication list Current Facility-Administered Medications  Medication Dose Route Frequency Provider Last Rate Last Dose  . 0.9 %  sodium chloride infusion  250 mL Intravenous PRN Cristal Ford, DO      . acetaminophen (TYLENOL) tablet 650 mg  650 mg Oral Q6H PRN Cristal Ford, DO       Or  . acetaminophen (TYLENOL) suppository 650 mg  650 mg Rectal Q6H PRN Cristal Ford, DO      . amLODipine (NORVASC) tablet 5 mg  5 mg Oral Daily Cristal Ford, DO   5 mg at 06/15/18 0814  . atorvastatin (LIPITOR) tablet 40 mg  40 mg Oral QHS Mikhail, [place], DO   40 mg at 06/14/18 2116  . Chlorhexidine Gluconate Cloth 2 % PADS 6 each  6 each Topical Q0600 Elmarie Shiley, MD   6 each at 06/14/18 1105  . [START ON 06/16/2018] Darbepoetin Alfa (ARANESP) injection 60 mcg  60 mcg Intravenous Q Sat-HD Elmarie Shiley, MD      . feeding supplement (NEPRO CARB STEADY) liquid 237 mL  237 mL Oral BID BM Mikhail, Velta [place], DO   Stopped at 06/15/18 1306  . heparin injection 5,000 Units  5,000 Units Subcutaneous Q8H Mikhail, [place], DO      . insulin aspart (novoLOG) injection 0-9 Units  0-9 Units Subcutaneous TID WC Cristal Ford, DO   2 Units at 06/15/18 1313  . morphine 2 MG/ML injection 2 mg  2 mg Intravenous Q6H PRN Mikhail, Velta Addison, DO      . ondansetron (ZOFRAN) tablet 4 mg  4 mg Oral Q6H PRN Cristal Ford, DO       Or  . ondansetron [facility]) injection 4 mg  4 mg Intravenous Q6H PRN Cristal Ford, DO      . oxyCODONE (Oxy IR/ROXICODONE) immediate release tablet 5 mg  5 mg Oral Q6H PRN Cristal Ford, DO   5 mg at 06/15/18 1321  . sodium chloride flush (NS) 0.9 % injection 3 mL  3 mL Intravenous Q12H Mikhail, [place], DO   3 mL at 06/15/18 0946  . sodium chloride flush (NS) 0.9 % injection 3 mL   3 mL Intravenous PRN Cristal Ford, DO         Discharge Medications: Please see discharge summary for a list of discharge medications.  Relevant Imaging Results:  Relevant Lab Results:   Additional Information [id number]  Gets HD TTS High POint  Benard Halsted, LCSW

## 2018-06-15 NOTE — Clinical Social Work Note (Signed)
Clinical Social Work Assessment  Patient Details  Name: Mary Aguirre MRN: [id number] Date of Birth: 07-26-1926  Date of referral:  06/15/18               Reason for consult:  Facility Placement                Permission sought to share information with:  Facility Sport and exercise psychologist, Family Supports Permission granted to share information::  Yes, Verbal Permission Granted  Name::     Forensic psychologist::  SNFs  Relationship::  Granddaughter  Contact Information:  [phone]  Housing/Transportation Living arrangements for the past 2 months:  [place] of Information:  Patient, Adult Children Patient Interpreter Needed:  None Criminal Activity/Legal Involvement Pertinent to Current Situation/Hospitalization:  No - Comment as needed Significant Relationships:  Adult Children, Other Family Members Lives with:  Self, Other (Comment)(Caregivers) Do you feel safe going back to the place where you live?  No Need for family participation in patient care:  Yes (Comment)  Care giving concerns:  CSW received consult for possible SNF placement at time of discharge. CSW spoke with patient regarding PT recommendation of SNF placement at time of discharge. Patient reported that she is planning on going back home at discharge, where her daughter lives with her. She reports that she is afraid to move to much due to her shoulder pain. She gave CSW permission to contact her daughter to confirm the plan.CSW to continue to follow and assist with discharge planning needs.   Social Worker assessment / plan:  CSW spoke with patient, daughter, and granddaughter concerning possibility of rehab at [facility] before returning home.  Employment status:  Retired Nurse, adult PT Recommendations:  [place] / Referral to community resources:  [place]  Patient/Family's Response to care:  CSW called patient's daughter to  determine discharge plan. She stated that they wanted patient placed. CSW explained that patient is currently still in copay days at [facility] and would be required to pay money up front. She stated that I would need to speak with Beckie Busing, patient's granddaughter regarding the finances. CSW spoke with [place]. She stated that she was on her way from [place] to speak with the family about getting the money together. She reported preference for [place]. Ronney Lion has started insurance authorization, which may not come through until Monday. CSW also explained long term placement process and Beckie Busing stated that they are not interested in long term care since patient's house is paid for and they pay an aide to come in and help her. She reported that patient does not qualify for Medicaid yet because her husband left her properties when he passed this year.   Patient/Family's Understanding of and Emotional Response to Diagnosis, Current Treatment, and Prognosis:  Patient/family is realistic regarding therapy needs and expressed being hopeful for SNF placement. Patient's granddaughter expressed understanding of CSW role and discharge process as well as medical condition. No questions/concerns about plan or treatment.    Emotional Assessment Appearance:  Appears stated age Attitude/Demeanor/Rapport:  Gracious Affect (typically observed):  Accepting, Appropriate Orientation:  Oriented to Self, Oriented to Place, Oriented to  Time, Oriented to Situation Alcohol / Substance use:  Not Applicable Psych involvement (Current and /or in the community):  No (Comment)  Discharge Needs  Concerns to be addressed:  Care Coordination Readmission within the last 30 days:  No Current discharge risk:  None Barriers to Discharge:  Insurance Authorization  Benard Halsted, LCSW 06/15/2018, 5:59 PM

## 2018-06-15 NOTE — Progress Notes (Signed)
PT Cancellation Note  Patient Details Name: Mary Aguirre MRN: [id number] DOB: [birth year]   Cancelled Treatment:    Reason Eval/Treat Not Completed: Other (comment)(Chart reviewed, RN consulted. Pt still refusing to participate in basic mobility training at this time. PT explained utility of LUE sling for comfort, but pt is not convinced. ) Pt educated on concern with prolonged inactivity as pertaining to her long term prognosis for independence with transfers/AMB. Pt is not persuaded by attempts to motivate. Will attempt again at later date/time.   3:56 PM, 06/15/18 Etta Grandchild, PT, DPT Physical Therapist - [place] [phone] (Pager)  [phone] (Office)    Buccola,Allan C 06/15/2018, 3:56 PM

## 2018-06-15 NOTE — Progress Notes (Signed)
PROGRESS NOTE    Mary Aguirre  WJX:[id number] DOB: 1926-11-03 DOA: 06/13/2018 PCP: Drake Leach, MD   Brief Narrative:  HPI on 06/13/2018 Mary Aguirre is a 85 y.o. female with a medical history of diabetes, ESRD, hypertension, who presented to the emergency department after falling this morning approximately 3 times.  Patient remembers hitting her shoulder on the toilet.  She denies losing consciousness or hitting her head.  EMS was called, upon arrival patient was found to have a blood sugar of 43, and she was given dextrose.  Patient received her last dose of insulin the evening of 06/12/2018. She denies any chest pain, shortness of breath, abdominal pain, nausea or vomiting, diarrhea or constipation, dizziness or headache, recent illness or travel. Her daughter tells me she has dementia.  She only complains of having left shoulder pain at this time.  Interim history Patient admitted after a fall and hypoglycemia.  Found to have a scapular fracture.  PT, OT recommending SNF.  Nephrology also consulted for ESRD. Assessment & Plan   Fall with scapular fracture -Suspect secondary to hypoglycemia -CT chest showed comminuted mildly displaced fracture of the body of the left scapula.  Suspected nondisplaced fractures of the left fourth through seventh ribs.  T9 superior endplate compression fracture new since 2006 however has nonacute appearance. -Orthopedic surgery consulted, CT scan ordered.  Hoping to manage nonoperatively however patient may need sling for comfort.  Pressure will need to follow-up with Dr. Griffin Basil in 2 to 3 weeks. -CT showed comminuted and moderately displaced fracture of the left scapular body.  No involvement of the glenoid, coracoid process or acromion.  Proximal humerus is intact and located. -Continue pain control- patient has used oral medications,oxycodone  -PT/OT consulted and recommended SNF.   -Social work consulted, pending SNF  Diabetes mellitus, type II  complicated by hypoglycemia  -Patient takes Levemir 6 units nightly, last dose was 06/12/2018 evening -Patient was found a blood sugar in the 40s upon arrival of EMS, she was given D50 -Hold Levemir, placed on insulin sliding scale with CBG monitoring -Last hemoglobin A1c was 5.6 04/19/2018 -Given that hemoglobin A1c is 5.6, and blood sugars have been low to normal, do not feel patient should be discharged with any insulin.  Essential hypertension -Continue amlodipine, hold Bumex  ESRD -Patient dialyzes Tuesday, Thursday, Saturday -Nephrology consulted and appreciated  Adrenal mass -CT noted left adrenal mass which is slowly enlarging since 2006 -Continue outpatient follow-up  DVT Prophylaxis  Heparin  Code Status: Full  Family Communication: None at bedside  Disposition Plan: Observation. Pending SNF placement.   Consultants Orthopedic surgery Nephrology  Procedures  None  Antibiotics   Anti-infectives (From admission, onward)   None      Subjective:   Mary Aguirre seen and examined today.  He is to have left-sided pain especially when she moves her arm or takes a deep breath.  Denies current chest pain, shortness breath, abdominal pain, nausea or vomiting, diarrhea constipation, dizziness or headache.  Objective:   Vitals:   06/14/18 1642 06/14/18 2228 06/15/18 0648 06/15/18 0830  BP: (!) 158/71 (!) 138/58 (!) 139/57 (!) 147/76  Pulse: (!) 104 (!) 107 100 (!) 108  Resp: 18 18 18    Temp: 97.6 F (36.4 C) 98.6 F (37 C) 98.3 F (36.8 C)   TempSrc: Oral Oral Oral   SpO2: 100% 98% 100%   Weight: 51.3 kg     Height:        Intake/Output Summary (Last  24 hours) at 06/15/2018 1458 Last data filed at 06/15/2018 0946 Gross per 24 hour  Intake 3 ml  Output 1515 ml  Net -1512 ml   Filed Weights   06/14/18 0212 06/14/18 1258 06/14/18 1642  Weight: 53.8 kg 52.8 kg 51.3 kg   Exam  General: Well developed, elderly, NAD  HEENT: NCAT, mucous membranes moist.     Neck: Supple  Cardiovascular: S1 S2 auscultated, RRR, no murmur  Respiratory: Clear to auscultation bilaterally with equal chest rise  Abdomen: Soft, nontender, nondistended, + bowel sounds  Extremities: warm dry without cyanosis clubbing or edema  Neuro: AAOx3, nonfocal  Psych: Pleasant, appropriate mood and affect  Data Reviewed: I have personally reviewed following labs and imaging studies  CBC: Recent Labs  Lab 06/13/18 1007 06/14/18 0355 06/15/18 0450  WBC 8.9 8.7  --   NEUTROABS 7.7  --   --   HGB 9.4* 8.4* 8.8*  HCT 29.2* 25.2* 26.0*  MCV 95.1 92.0  --   PLT 289 269  --    Basic Metabolic Panel: Recent Labs  Lab 06/13/18 1007 06/14/18 0355 06/15/18 0450  NA 138 137 135  K 4.7 4.5 4.1  CL 101 104 100  CO2 24 22 25   GLUCOSE 94 86 116*  BUN 19 26* 18  CREATININE 3.87* 4.87* 3.39*  CALCIUM 8.7* 7.9* 8.9  PHOS  --   --  4.8*   GFR: Estimated Creatinine Clearance: 8.9 mL/min (A) (by C-G formula based on SCr of 3.39 mg/dL (H)). Liver Function Tests: Recent Labs  Lab 06/13/18 1007 06/15/18 0450  AST 22  --   ALT 13  --   ALKPHOS 333*  --   BILITOT 0.3  --   PROT 7.0  --   ALBUMIN 3.6 2.9*   Recent Labs  Lab 06/13/18 1007  LIPASE 78*   No results for input(s): AMMONIA in the last 168 hours. Coagulation Profile: No results for input(s): INR, PROTIME in the last 168 hours. Cardiac Enzymes: No results for input(s): CKTOTAL, CKMB, CKMBINDEX, TROPONINI in the last 168 hours. BNP (last 3 results) No results for input(s): PROBNP in the last 8760 hours. HbA1C: No results for input(s): HGBA1C in the last 72 hours. CBG: Recent Labs  Lab 06/14/18 1752 06/14/18 2110 06/15/18 0845 06/15/18 1152 06/15/18 1254  GLUCAP 166* 162* 112* 135* 156*   Lipid Profile: No results for input(s): CHOL, HDL, LDLCALC, TRIG, CHOLHDL, LDLDIRECT in the last 72 hours. Thyroid Function Tests: No results for input(s): TSH, T4TOTAL, FREET4, T3FREE, THYROIDAB in the  last 72 hours. Anemia Panel: No results for input(s): VITAMINB12, FOLATE, FERRITIN, TIBC, IRON, RETICCTPCT in the last 72 hours. Urine analysis:    Component Value Date/Time   COLORURINE STRAW (A) 06/13/2018 1540   APPEARANCEUR CLEAR 06/13/2018 1540   LABSPEC 1.006 06/13/2018 1540   PHURINE 9.0 (H) 06/13/2018 1540   GLUCOSEU 50 (A) 06/13/2018 1540   HGBUR NEGATIVE 06/13/2018 1540   BILIRUBINUR NEGATIVE 06/13/2018 1540   KETONESUR NEGATIVE 06/13/2018 1540   PROTEINUR 100 (A) 06/13/2018 1540   NITRITE NEGATIVE 06/13/2018 1540   LEUKOCYTESUR NEGATIVE 06/13/2018 1540   Sepsis Labs: @LABRCNTIP (procalcitonin:4,lacticidven:4)  ) Recent Results (from the past 240 hour(s))  Urine culture     Status: Abnormal   Collection Time: 06/13/18  3:37 PM  Result Value Ref Range Status   Specimen Description URINE, CLEAN CATCH  Final   Special Requests   Final    NONE Performed at [facility], [address] [place], [place] [address]    Culture MULTIPLE SPECIES PRESENT, SUGGEST RECOLLECTION (A)  Final   Report Status 06/14/2018 FINAL  Final  MRSA PCR Screening     Status: None   Collection Time: 06/13/18  8:34 PM  Result Value Ref Range Status   MRSA by PCR NEGATIVE NEGATIVE Final    Comment:        The GeneXpert MRSA Assay (FDA approved for NASAL specimens only), is one component of a comprehensive MRSA colonization surveillance program. It is not intended to diagnose MRSA infection nor to guide or monitor treatment for MRSA infections. Performed at [facility], [place] [address], [place], [place] [address]       Radiology Studies: Ct Chest Wo Contrast  Result Date: 06/13/2018 CLINICAL DATA:  85 year old female with multiple falls. Left shoulder pain, scapula fracture. EXAM: CT CHEST WITHOUT CONTRAST TECHNIQUE: Multidetector CT imaging of the chest was performed following the standard protocol without IV contrast. COMPARISON:  Chest and shoulder radiographs from  earlier today. Thoracic spine radiographs 01/27/2018. Chest CT 02/15/2005. FINDINGS: Cardiovascular: Right IJ approach dual lumen dialysis type catheter. Extensive Calcified aortic atherosclerosis. Calcified coronary artery atherosclerosis. Vascular patency is not evaluated in the absence of IV contrast. Mild cardiomegaly. Mild pericardial effusion is new since 2006. Mediastinum/Nodes: No mediastinal lymphadenopathy. Lungs/Pleura: Trace bilateral pleural fluid. No pneumothorax. Major airways are clear. Mild centrilobular emphysema is chronic and stable since 2006. Minimal to mild bilateral pulmonary atelectasis. Upper Abdomen: Partially visible and enlarged chronic left adrenal mass which was 31 millimeters diameter in 2006 and now at least 41 millimeters. This has become partially calcified (series 3, image 153). Otherwise negative visible noncontrast upper abdominal viscera. Musculoskeletal: Comminuted mildly displaced fracture of the body of the left scapula (sagittal image 81). The visible left clavicle and humerus remain intact. Visible right shoulder osseous structures are intact. Nondisplaced left anterolateral 4th through 7th rib fractures are suspected (series 4, image 72). No displaced rib fracture identified. An inferior endplate fracture of T8 appears stable since September. A superior endplate deformity of T9 seems new since then but has a nonacute appearance now. Mild T10 inferior endplate deformity appears chronic and stable. IMPRESSION: 1. Comminuted mildly displaced fracture of the body of the left scapula. Suspect nondisplaced fractures of the left 4th through 7th ribs. 2. Mild cardiomegaly and pericardial effusion. Trace bilateral pleural fluid. 3. T9 superior endplate compression fracture is new since 2006 but has a non-acute appearance. 4. Slowly enlarging left adrenal mass since 2006 is of indeterminate histology, but unlikely malignant given the prolonged time course. Electronically Signed    By: Genevie Ann M.D.   On: 06/13/2018 16:18   Ct No Charge  Result Date: 06/13/2018 CLINICAL DATA:  Shoulder pain after falling. Left scapular fracture on chest CT. EXAM: CT OF THE UPPER LEFT EXTREMITY WITHOUT CONTRAST TECHNIQUE: Multidetector reformatted CT images of the left shoulder were generated from the chest CT performed earlier. COMPARISON:  Radiographs same date. FINDINGS: Bones/Joint/Cartilage Again demonstrated is a comminuted and moderately displaced fracture of the left scapular body. There is no involvement of the glenoid. The coracoid process and acromion are intact. The humeral head is located. No evidence of proximal left humeral fracture. No evidence of chest wall or left clavicle injury. There are underlying sternoclavicular acromioclavicular degenerative changes. Ligaments Suboptimally assessed by CT. Muscles and Tendons Soft tissue swelling around the scapula involving the subscapularis, infraspinatus and teres minor muscles. No focal hematoma identified. Soft tissues Surgical  clips in the left axilla from previous axillary node dissection. No adenopathy or focal hematoma identified. IMPRESSION: 1. Comminuted and moderately displaced fracture of the left scapular body. No involvement of glenoid, coracoid process or acromion. 2. The proximal humerus is intact and located. Electronically Signed   By: Richardean Sale M.D.   On: 06/13/2018 18:33     Scheduled Meds: . amLODipine  5 mg Oral Daily  . atorvastatin  40 mg Oral QHS  . Chlorhexidine Gluconate Cloth  6 each Topical Q0600  . [START ON 06/16/2018] darbepoetin (ARANESP) injection - DIALYSIS  60 mcg Intravenous Q Sat-HD  . feeding supplement (NEPRO CARB STEADY)  237 mL Oral BID BM  . heparin  5,000 Units Subcutaneous Q8H  . insulin aspart  0-9 Units Subcutaneous TID WC  . sodium chloride flush  3 mL Intravenous Q12H   Continuous Infusions: . sodium chloride       LOS: 0 days   Time Spent in minutes   30 minutes  Maniah Nading D.O. on 06/15/2018 at 2:58 PM  Between 7am to 7pm - Please see pager noted on [email or web address]  After 7pm go to [email or web address]  And look for the night coverage person covering for me after hours  [facility] Office  [phone]

## 2018-06-15 NOTE — Progress Notes (Signed)
Patient ID: Mary Aguirre, female   DOB: 1927-05-02, 85 y.o.   MRN: [id number] [facility] Progress Note   Assessment/ Plan:   1.  Left scapular fracture: Plans noted for conservative management possibly with sling and continued follow-up as an outpatient.  Recommendations noted for continued outpatient PT/OT. 2.  End-stage renal disease: We will order for hemodialysis to continue TTS schedule. I have ordered for hemodialysis again tomorrow. 3.  Hypertension: Continue oral antihypertensive therapy and monitor with hemodialysis. 4.  Anemia of chronic kidney disease:  Continue hemoglobin and hematocrit monitoring on ESA. 5.  Metabolic bone disease: We will continue to trend calcium/phosphorus levels and awaiting records regarding binder/VDRA dosing.  Subjective:   Reports some discontent with hemodialysis and the delay of getting back to her room yesterday.   Objective:   BP (!) 147/76 (BP Location: Right Arm) Comment (BP Location): forearm  Pulse (!) 108   Temp 98.3 F (36.8 C) (Oral)   Resp 18   Ht 5\' 2"  (1.575 m)   Wt 51.3 kg   SpO2 100%   BMI 20.69 kg/m   Physical Exam: Gen: Comfortably sitting up in recliner CVS: Pulse regular tachycardia, S1 and S2 normal Resp: Clear to auscultation bilaterally, no rales/rhonchi.  Right IJ TDC Abd: Soft, flat and nontender Ext: No lower extremity edema.  Labs: BMET Recent Labs  Lab 06/13/18 1007 06/14/18 0355 06/15/18 0450  NA 138 137 135  K 4.7 4.5 4.1  CL 101 104 100  CO2 24 22 25   GLUCOSE 94 86 116*  BUN 19 26* 18  CREATININE 3.87* 4.87* 3.39*  CALCIUM 8.7* 7.9* 8.9  PHOS  --   --  4.8*   CBC Recent Labs  Lab 06/13/18 1007 06/14/18 0355 06/15/18 0450  WBC 8.9 8.7  --   NEUTROABS 7.7  --   --   HGB 9.4* 8.4* 8.8*  HCT 29.2* 25.2* 26.0*  MCV 95.1 92.0  --   PLT 289 269  --    Medications:    . amLODipine  5 mg Oral Daily  . atorvastatin  40 mg Oral QHS  . Chlorhexidine Gluconate Cloth  6 each  Topical Q0600  . feeding supplement (NEPRO CARB STEADY)  237 mL Oral BID BM  . heparin  5,000 Units Subcutaneous Q8H  . insulin aspart  0-9 Units Subcutaneous TID WC  . sodium chloride flush  3 mL Intravenous Q12H   Elmarie Shiley, MD 06/15/2018, 9:51 AM

## 2018-06-15 NOTE — Care Management Obs Status (Signed)
[place] NOTIFICATION   Patient Details  Name: Mary Aguirre MRN: [id number] Date of Birth: 07/09/1926   Medicare Observation Status Notification Given:  Yes    Erenest Rasher, RN 06/15/2018, 3:35 PM

## 2018-06-16 DIAGNOSIS — N186 End stage renal disease: Secondary | ICD-10-CM | POA: Diagnosis not present

## 2018-06-16 DIAGNOSIS — E118 Type 2 diabetes mellitus with unspecified complications: Secondary | ICD-10-CM | POA: Diagnosis not present

## 2018-06-16 DIAGNOSIS — I10 Essential (primary) hypertension: Secondary | ICD-10-CM | POA: Diagnosis not present

## 2018-06-16 DIAGNOSIS — E162 Hypoglycemia, unspecified: Secondary | ICD-10-CM | POA: Diagnosis not present

## 2018-06-16 DIAGNOSIS — E440 Moderate protein-calorie malnutrition: Secondary | ICD-10-CM

## 2018-06-16 LAB — RENAL FUNCTION PANEL
Albumin: 2.9 g/dL — ABNORMAL LOW (ref 3.5–5.0)
Anion gap: 14 (ref 5–15)
BUN: 50 mg/dL — ABNORMAL HIGH (ref 8–23)
CALCIUM: 8.5 mg/dL — AB (ref 8.9–10.3)
CO2: 25 mmol/L (ref 22–32)
Chloride: 92 mmol/L — ABNORMAL LOW (ref 98–111)
Creatinine, Ser: 5.47 mg/dL — ABNORMAL HIGH (ref 0.44–1.00)
GFR calc Af Amer: 7 mL/min — ABNORMAL LOW (ref >60)
GFR calc non Af Amer: 6 mL/min — ABNORMAL LOW (ref >60)
Glucose, Bld: 147 mg/dL — ABNORMAL HIGH (ref 70–99)
Phosphorus: 5.8 mg/dL — ABNORMAL HIGH (ref 2.5–4.6)
Potassium: 4.2 mmol/L (ref 3.5–5.1)
Sodium: 131 mmol/L — ABNORMAL LOW (ref 135–145)

## 2018-06-16 LAB — CBC
HCT: 25.5 % — ABNORMAL LOW (ref 36.0–46.0)
Hemoglobin: 8.2 g/dL — ABNORMAL LOW (ref 12.0–15.0)
MCH: 29.9 pg (ref 26.0–34.0)
MCHC: 32.2 g/dL (ref 30.0–36.0)
MCV: 93.1 fL (ref 80.0–100.0)
PLATELETS: 258 10*3/uL (ref 150–400)
RBC: 2.74 MIL/uL — ABNORMAL LOW (ref 3.87–5.11)
RDW: 14.8 % (ref 11.5–15.5)
WBC: 9.6 10*3/uL (ref 4.0–10.5)
nRBC: 0 % (ref 0.0–0.2)

## 2018-06-16 LAB — GLUCOSE, CAPILLARY
Glucose-Capillary: 120 mg/dL — ABNORMAL HIGH (ref 70–99)
Glucose-Capillary: 109 mg/dL — ABNORMAL HIGH (ref 70–99)
Glucose-Capillary: 117 mg/dL — ABNORMAL HIGH (ref 70–99)
Glucose-Capillary: 121 mg/dL — ABNORMAL HIGH (ref 70–99)
Glucose-Capillary: 109 mg/dL — ABNORMAL HIGH (ref 70–99)
Glucose-Capillary: 154 mg/dL — ABNORMAL HIGH (ref 70–99)

## 2018-06-16 MED ORDER — DARBEPOETIN ALFA 60 MCG/0.3ML IJ SOSY
PREFILLED_SYRINGE | INTRAMUSCULAR | Status: AC
  Administered 2018-06-16: 60 ug via INTRAVENOUS
  Filled 2018-06-16: qty 0.3

## 2018-06-16 MED ORDER — HEPARIN SODIUM (PORCINE) 1000 UNIT/ML DIALYSIS
40.0000 [IU]/kg | INTRAMUSCULAR | Status: DC | PRN
  Administered 2018-06-16: 2100 [IU] via INTRAVENOUS_CENTRAL
  Administered 2018-06-16: 3300 [IU] via INTRAVENOUS_CENTRAL
  Filled 2018-06-16 (×2): qty 2.1

## 2018-06-16 MED ORDER — HEPARIN SODIUM (PORCINE) 1000 UNIT/ML IJ SOLN
INTRAMUSCULAR | Status: AC
  Administered 2018-06-16: 2100 [IU] via INTRAVENOUS_CENTRAL
  Filled 2018-06-16: qty 3

## 2018-06-16 NOTE — Progress Notes (Addendum)
Pt refused to got to dialysis today. I as well as another nurse explained to her the risks that could occur if she did not go. She stated  she understood and still did not want to go. Doctor notified.

## 2018-06-16 NOTE — Progress Notes (Signed)
PROGRESS NOTE    [name]  ERX:[id number] DOB: Nov 23, 1926 DOA: 06/13/2018 PCP: Drake Leach, MD   Brief Narrative:  HPI on 06/13/2018 Mary Aguirre is a 85 y.o. female with a medical history of diabetes, ESRD, hypertension, who presented to the emergency department after falling this morning approximately 3 times.  Patient remembers hitting her shoulder on the toilet.  She denies losing consciousness or hitting her head.  EMS was called, upon arrival patient was found to have a blood sugar of 43, and she was given dextrose.  Patient received her last dose of insulin the evening of 06/12/2018. She denies any chest pain, shortness of breath, abdominal pain, nausea or vomiting, diarrhea or constipation, dizziness or headache, recent illness or travel. Her daughter tells me she has dementia.  She only complains of having left shoulder pain at this time.  Interim history Patient admitted after a fall and hypoglycemia.  Found to have a scapular fracture.  PT, OT recommending SNF.  Nephrology also consulted for ESRD. Assessment & Plan   Fall with scapular fracture -Suspect secondary to hypoglycemia -CT chest showed comminuted mildly displaced fracture of the body of the left scapula.  Suspected nondisplaced fractures of the left fourth through seventh ribs.  T9 superior endplate compression fracture new since 2006 however has nonacute appearance. -Orthopedic surgery consulted, CT scan ordered.  Hoping to manage nonoperatively however patient may need sling for comfort.  Pressure will need to follow-up with Dr. Griffin Basil in 2 to 3 weeks. -CT showed comminuted and moderately displaced fracture of the left scapular body.  No involvement of the glenoid, coracoid process or acromion.  Proximal humerus is intact and located. -Continue pain control- patient has used oral medications,oxycodone  -PT/OT consulted and recommended SNF.   -Social work consulted, pending SNF  Diabetes mellitus, type II  complicated by hypoglycemia  -Patient takes Levemir 6 units nightly, last dose was 06/12/2018 evening -Patient was found a blood sugar in the 40s upon arrival of EMS, she was given D50 -Hold Levemir, placed on insulin sliding scale with CBG monitoring -Last hemoglobin A1c was 5.6 04/19/2018 -Given that hemoglobin A1c is 5.6, and blood sugars have been low to normal, do not feel patient should be discharged with any insulin.  Essential hypertension -Continue amlodipine, hold Bumex  ESRD -Patient dialyzes Tuesday, Thursday, Saturday -Nephrology consulted and appreciated -Patient initially refusing hemodialysis today however now is agreeing to it.  Adrenal mass -CT noted left adrenal mass which is slowly enlarging since 2006 -Continue outpatient follow-up  Moderate malnutrition -Nutrition consulted, continue supplements  DVT Prophylaxis  Heparin  Code Status: Full  Family Communication: None at bedside  Disposition Plan: Observation. Pending SNF placement.   Consultants Orthopedic surgery Nephrology  Procedures  None  Antibiotics   Anti-infectives (From admission, onward)   None      Subjective:   Mary Aguirre seen and examined today.  Seems to have left-sided pain when she moves her arms or takes a deep breath.  States she does not want to dialyze today given what happened yesterday.-Explained to patient she did not have dialysis yesterday.  She is now agreeable to have hemodialysis today.  Denies chest pain, shortness of breath, abdominal pain, nausea or vomiting, diarrhea constipation. Objective:   Vitals:   06/15/18 0830 06/15/18 1414 06/15/18 2259 06/16/18 0511  BP: (!) 147/76 (!) 137/54 (!) 145/53 (!) 160/58  Pulse: (!) 108 (!) 104 100 (!) 101  Resp:  20 18 18   Temp:  98  F (36.7 C) 98.7 F (37.1 C) 98.9 F (37.2 C)  TempSrc:  Oral  Oral  SpO2:  100% 98% 100%  Weight:    52.6 kg  Height:        Intake/Output Summary (Last 24 hours) at 06/16/2018  1230 Last data filed at 06/16/2018 1119 Gross per 24 hour  Intake 246 ml  Output 700 ml  Net -454 ml   Filed Weights   06/14/18 1258 06/14/18 1642 06/16/18 0511  Weight: 52.8 kg 51.3 kg 52.6 kg   Exam  General: Well developed,elderly NAD  HEENT: NCAT, mucous membranes moist.   Neck: Supple  Cardiovascular: S1 S2 auscultated, RRR- tachycardic, no murmur  Respiratory: Clear to auscultation bilaterally with equal chest rise  Abdomen: Soft, nontender, nondistended, + bowel sounds  Extremities: warm dry without cyanosis clubbing or edema  Neuro: AAOx3, nonfocal (some confusion)  Psych: Appropriate however argumentative  Data Reviewed: I have personally reviewed following labs and imaging studies  CBC: Recent Labs  Lab 06/13/18 1007 06/14/18 0355 06/15/18 0450  WBC 8.9 8.7  --   NEUTROABS 7.7  --   --   HGB 9.4* 8.4* 8.8*  HCT 29.2* 25.2* 26.0*  MCV 95.1 92.0  --   PLT 289 269  --    Basic Metabolic Panel: Recent Labs  Lab 06/13/18 1007 06/14/18 0355 06/15/18 0450  NA 138 137 135  K 4.7 4.5 4.1  CL 101 104 100  CO2 24 22 25   GLUCOSE 94 86 116*  BUN 19 26* 18  CREATININE 3.87* 4.87* 3.39*  CALCIUM 8.7* 7.9* 8.9  PHOS  --   --  4.8*   GFR: Estimated Creatinine Clearance: 8.9 mL/min (A) (by C-G formula based on SCr of 3.39 mg/dL (H)). Liver Function Tests: Recent Labs  Lab 06/13/18 1007 06/15/18 0450  AST 22  --   ALT 13  --   ALKPHOS 333*  --   BILITOT 0.3  --   PROT 7.0  --   ALBUMIN 3.6 2.9*   Recent Labs  Lab 06/13/18 1007  LIPASE 78*   No results for input(s): AMMONIA in the last 168 hours. Coagulation Profile: No results for input(s): INR, PROTIME in the last 168 hours. Cardiac Enzymes: No results for input(s): CKTOTAL, CKMB, CKMBINDEX, TROPONINI in the last 168 hours. BNP (last 3 results) No results for input(s): PROBNP in the last 8760 hours. HbA1C: No results for input(s): HGBA1C in the last 72 hours. CBG: Recent Labs  Lab  06/15/18 1254 06/15/18 1649 06/15/18 1851 06/15/18 2058 06/16/18 0811  GLUCAP 156* 120* 109* 169* 117*   Lipid Profile: No results for input(s): CHOL, HDL, LDLCALC, TRIG, CHOLHDL, LDLDIRECT in the last 72 hours. Thyroid Function Tests: No results for input(s): TSH, T4TOTAL, FREET4, T3FREE, THYROIDAB in the last 72 hours. Anemia Panel: No results for input(s): VITAMINB12, FOLATE, FERRITIN, TIBC, IRON, RETICCTPCT in the last 72 hours. Urine analysis:    Component Value Date/Time   COLORURINE STRAW (A) 06/13/2018 1540   APPEARANCEUR CLEAR 06/13/2018 1540   LABSPEC 1.006 06/13/2018 1540   PHURINE 9.0 (H) 06/13/2018 1540   GLUCOSEU 50 (A) 06/13/2018 1540   HGBUR NEGATIVE 06/13/2018 1540   BILIRUBINUR NEGATIVE 06/13/2018 1540   KETONESUR NEGATIVE 06/13/2018 1540   PROTEINUR 100 (A) 06/13/2018 1540   NITRITE NEGATIVE 06/13/2018 1540   LEUKOCYTESUR NEGATIVE 06/13/2018 1540   Sepsis Labs: @LABRCNTIP (procalcitonin:4,lacticidven:4)  ) Recent Results (from the past 240 hour(s))  Urine culture     Status: Abnormal  Collection Time: 06/13/18  3:37 PM  Result Value Ref Range Status   Specimen Description URINE, CLEAN CATCH  Final   Special Requests   Final    NONE Performed at [facility], [place] [address] [place], [place] [address]    Culture MULTIPLE SPECIES PRESENT, SUGGEST RECOLLECTION (A)  Final   Report Status 06/14/2018 FINAL  Final  MRSA PCR Screening     Status: None   Collection Time: 06/13/18  8:34 PM  Result Value Ref Range Status   MRSA by PCR NEGATIVE NEGATIVE Final    Comment:        The GeneXpert MRSA Assay (FDA approved for NASAL specimens only), is one component of a comprehensive MRSA colonization surveillance program. It is not intended to diagnose MRSA infection nor to guide or monitor treatment for MRSA infections. Performed at [facility], [place] [address] [place], [place] [address]       Radiology Studies: No results  found.   Scheduled Meds: . amLODipine  5 mg Oral Daily  . atorvastatin  40 mg Oral QHS  . Chlorhexidine Gluconate Cloth  6 each Topical Q0600  . darbepoetin (ARANESP) injection - DIALYSIS  60 mcg Intravenous Q Sat-HD  . feeding supplement (NEPRO CARB STEADY)  237 mL Oral BID BM  . heparin  5,000 Units Subcutaneous Q8H  . insulin aspart  0-9 Units Subcutaneous TID WC  . sodium chloride flush  3 mL Intravenous Q12H   Continuous Infusions: . sodium chloride       LOS: 0 days   Time Spent in minutes   30 minutes  Odette Watanabe D.O. on 06/16/2018 at 12:30 PM  Between 7am to 7pm - Please see pager noted on [email or web address]  After 7pm go to [email or web address]  And look for the night coverage person covering for me after hours  [facility] Office  [phone]

## 2018-06-16 NOTE — Progress Notes (Signed)
Patient ID: Mary Aguirre, female   DOB: 03-10-1927, 85 y.o.   MRN: [id number] [facility] Progress Note   Assessment/ Plan:   1.  Left scapular fracture: Plans noted for conservative management with pain management and sling to immobilize left shoulder/scapula, outpatient physical therapy/Occupational Therapy is recommended. 2.  End-stage renal disease: We will continue dialysis on a TTS schedule. She is now agreeable for dialysis after initially refusing it earlier today.  Right IJ TDC with no permanent access. 3.  Hypertension: Continue oral antihypertensive therapy and monitor with hemodialysis. 4.  Anemia of chronic kidney disease:  Continue hemoglobin and hematocrit monitoring on ESA. 5.  Metabolic bone disease: We will continue to trend calcium/phosphorus levels and awaiting records regarding binder/VDRA dosing.  Subjective:   Refused dialysis earlier today due to perceived problems with prior treatment, now agreeable.   Objective:   BP (!) 160/58 (BP Location: Right Arm)   Pulse (!) 101   Temp 98.9 F (37.2 C) (Oral)   Resp 18   Ht 5\' 2"  (1.575 m)   Wt 52.6 kg   SpO2 100%   BMI 21.21 kg/m   Physical Exam: Gen: Comfortably sitting up in recliner CVS: Pulse regular tachycardia, S1 and S2 normal Resp: Clear to auscultation bilaterally, no rales/rhonchi.  Right IJ TDC Abd: Soft, flat and nontender Ext: No lower extremity edema.  Labs: BMET Recent Labs  Lab 06/13/18 1007 06/14/18 0355 06/15/18 0450  NA 138 137 135  K 4.7 4.5 4.1  CL 101 104 100  CO2 24 22 25   GLUCOSE 94 86 116*  BUN 19 26* 18  CREATININE 3.87* 4.87* 3.39*  CALCIUM 8.7* 7.9* 8.9  PHOS  --   --  4.8*   CBC Recent Labs  Lab 06/13/18 1007 06/14/18 0355 06/15/18 0450  WBC 8.9 8.7  --   NEUTROABS 7.7  --   --   HGB 9.4* 8.4* 8.8*  HCT 29.2* 25.2* 26.0*  MCV 95.1 92.0  --   PLT 289 269  --    Medications:    . amLODipine  5 mg Oral Daily  . atorvastatin  40 mg Oral QHS  .  Chlorhexidine Gluconate Cloth  6 each Topical Q0600  . darbepoetin (ARANESP) injection - DIALYSIS  60 mcg Intravenous Q Sat-HD  . feeding supplement (NEPRO CARB STEADY)  237 mL Oral BID BM  . heparin  5,000 Units Subcutaneous Q8H  . insulin aspart  0-9 Units Subcutaneous TID WC  . sodium chloride flush  3 mL Intravenous Q12H   Elmarie Shiley, MD 06/16/2018, 9:33 AM

## 2018-06-17 DIAGNOSIS — E118 Type 2 diabetes mellitus with unspecified complications: Secondary | ICD-10-CM | POA: Diagnosis not present

## 2018-06-17 DIAGNOSIS — I10 Essential (primary) hypertension: Secondary | ICD-10-CM | POA: Diagnosis not present

## 2018-06-17 DIAGNOSIS — E162 Hypoglycemia, unspecified: Secondary | ICD-10-CM | POA: Diagnosis not present

## 2018-06-17 DIAGNOSIS — N186 End stage renal disease: Secondary | ICD-10-CM | POA: Diagnosis not present

## 2018-06-17 LAB — GLUCOSE, CAPILLARY
GLUCOSE-CAPILLARY: 117 mg/dL — AB (ref 70–99)
Glucose-Capillary: 139 mg/dL — ABNORMAL HIGH (ref 70–99)
Glucose-Capillary: 161 mg/dL — ABNORMAL HIGH (ref 70–99)
Glucose-Capillary: 136 mg/dL — ABNORMAL HIGH (ref 70–99)

## 2018-06-17 NOTE — Progress Notes (Signed)
Patient ID: Mary Aguirre, female   DOB: Aug 06, 1926, 86 y.o.   MRN: [id number] [facility] Progress Note   Assessment/ Plan:   1.  Left scapular fracture: Plans noted for conservative management with pain management and sling to immobilize left shoulder/scapula, plans noted for placement to [facility] skilled nursing facility for ongoing rehabilitation. 2.  End-stage renal disease: Continue dialysis on a TTS schedule, she does not have any acute indications at this time.  Anticipate discharge possibly tomorrow to resume outpatient dialysis beginning Tuesday at her dialysis unit.  Right IJ TDC with no permanent access. 3.  Hypertension: Continue oral antihypertensive therapy and monitor with hemodialysis. 4.  Anemia of chronic kidney disease:  Continue hemoglobin and hematocrit monitoring on ESA. 5.  Metabolic bone disease:  Continue renal diet/phosphorus binders.  Mild hyperphosphatemia noted on labs.  Subjective:   She complains of a terrible experience with dialysis yesterday.  She was most annoyed about the discrepancy with timings of going on and coming off her treatment.   Objective:   BP (!) 143/60 (BP Location: Right Arm)   Pulse (!) 110   Temp 99 F (37.2 C)   Resp 19   Ht 5\' 2"  (1.575 m)   Wt 50.9 kg   SpO2 97%   BMI 20.52 kg/m   Physical Exam: Gen: Comfortably laying flat in bed CVS: Pulse regular tachycardia, S1 and S2 normal Resp: Clear to auscultation bilaterally, no rales/rhonchi.  Right IJ TDC Abd: Soft, flat and nontender Ext: No lower extremity edema.  Labs: BMET Recent Labs  Lab 06/13/18 1007 06/14/18 0355 06/15/18 0450 06/16/18 1302  NA 138 137 135 131*  K 4.7 4.5 4.1 4.2  CL 101 104 100 92*  CO2 24 22 25 25   GLUCOSE 94 86 116* 147*  BUN 19 26* 18 50*  CREATININE 3.87* 4.87* 3.39* 5.47*  CALCIUM 8.7* 7.9* 8.9 8.5*  PHOS  --   --  4.8* 5.8*   CBC Recent Labs  Lab 06/13/18 1007 06/14/18 0355 06/15/18 0450 06/16/18 1302  WBC 8.9  8.7  --  9.6  NEUTROABS 7.7  --   --   --   HGB 9.4* 8.4* 8.8* 8.2*  HCT 29.2* 25.2* 26.0* 25.5*  MCV 95.1 92.0  --  93.1  PLT 289 269  --  258   Medications:    . amLODipine  5 mg Oral Daily  . atorvastatin  40 mg Oral QHS  . Chlorhexidine Gluconate Cloth  6 each Topical Q0600  . darbepoetin (ARANESP) injection - DIALYSIS  60 mcg Intravenous Q Sat-HD  . feeding supplement (NEPRO CARB STEADY)  237 mL Oral BID BM  . heparin  5,000 Units Subcutaneous Q8H  . insulin aspart  0-9 Units Subcutaneous TID WC  . sodium chloride flush  3 mL Intravenous Q12H   Elmarie Shiley, MD 06/17/2018, 10:00 AM

## 2018-06-17 NOTE — Progress Notes (Signed)
Patient at this time does not have any IV medications ordered so no PIV is needed. If anything changes RN will notify IV team

## 2018-06-17 NOTE — Progress Notes (Signed)
PROGRESS NOTE    Mary Aguirre  YOV:[id number] DOB: 1927/01/20 DOA: 06/13/2018 PCP: Drake Leach, MD   Brief Narrative:  HPI on 06/13/2018 Mary Aguirre is a 85 y.o. female with a medical history of diabetes, ESRD, hypertension, who presented to the emergency department after falling this morning approximately 3 times.  Patient remembers hitting her shoulder on the toilet.  She denies losing consciousness or hitting her head.  EMS was called, upon arrival patient was found to have a blood sugar of 43, and she was given dextrose.  Patient received her last dose of insulin the evening of 06/12/2018. She denies any chest pain, shortness of breath, abdominal pain, nausea or vomiting, diarrhea or constipation, dizziness or headache, recent illness or travel. Her daughter tells me she has dementia.  She only complains of having left shoulder pain at this time.  Interim history Patient admitted after a fall and hypoglycemia.  Found to have a scapular fracture.  PT, OT recommending SNF.  Nephrology also consulted for ESRD. Assessment & Plan   Fall with scapular fracture -Suspect secondary to hypoglycemia -CT chest showed comminuted mildly displaced fracture of the body of the left scapula.  Suspected nondisplaced fractures of the left fourth through seventh ribs.  T9 superior endplate compression fracture new since 2006 however has nonacute appearance. -Orthopedic surgery consulted, CT scan ordered.  Hoping to manage nonoperatively however patient may need sling for comfort.  Pressure will need to follow-up with Dr. Griffin Basil in 2 to 3 weeks. -CT showed comminuted and moderately displaced fracture of the left scapular body.  No involvement of the glenoid, coracoid process or acromion.  Proximal humerus is intact and located. -Continue pain control- patient has used oral medications,oxycodone  -PT/OT consulted and recommended SNF.   -Social work consulted, pending SNF  Diabetes mellitus, type II  complicated by hypoglycemia  -Patient takes Levemir 6 units nightly, last dose was 06/12/2018 evening -Patient was found a blood sugar in the 40s upon arrival of EMS, she was given D50 -Hold Levemir, placed on insulin sliding scale with CBG monitoring -Last hemoglobin A1c was 5.6 04/19/2018 -Given that hemoglobin A1c is 5.6, and blood sugars have been low to normal, do not feel patient should be discharged with any insulin. -CBGs have been very well controlled  Essential hypertension -Continue amlodipine, hold Bumex  ESRD -Patient dialyzes Tuesday, Thursday, Saturday -Nephrology consulted and appreciated -Patient did dialyze on 06/16/2018  Adrenal mass -CT noted left adrenal mass which is slowly enlarging since 2006 -Continue outpatient follow-up  Moderate malnutrition -Nutrition consulted, continue supplements  DVT Prophylaxis  Heparin  Code Status: Full  Family Communication: None at bedside  Disposition Plan: Observation. Pending SNF placement.   Consultants Orthopedic surgery Nephrology  Procedures  None  Antibiotics   Anti-infectives (From admission, onward)   None      Subjective:   Mary Aguirre seen and examined today.  Patient continues to have some pain but uses Tylenol.  Denies current chest pain, shortness breath, abdominal pain, nausea or vomiting, diarrhea or constipation, dizziness or headache.  Objective:   Vitals:   06/16/18 1632 06/16/18 1701 06/16/18 2222 06/17/18 0617  BP: 132/63 (!) 157/52 (!) 140/43 (!) 143/60  Pulse: (!) 102 (!) 107 (!) 102 (!) 110  Resp: 19 19    Temp: 98.2 F (36.8 C) 98 F (36.7 C) 98.4 F (36.9 C) 99 F (37.2 C)  TempSrc: Oral     SpO2: 97% 95% 99% 97%  Weight: 50.9 kg  Height:        Intake/Output Summary (Last 24 hours) at 06/17/2018 0950 Last data filed at 06/16/2018 1119 Gross per 24 hour  Intake 3 ml  Output 700 ml  Net -697 ml   Filed Weights   06/16/18 0511 06/16/18 1235 06/16/18 1632  Weight:  52.6 kg 52.7 kg 50.9 kg   Exam  General: Well developed, thin, no apparent distress  HEENT: NCAT, mucous membranes moist.   Neck: Supple  Cardiovascular: S1 S2 auscultated, RRR-tachycardic, no murmur  Respiratory: Clear to auscultation bilaterally with equal chest rise  Abdomen: Soft, nontender, nondistended, + bowel sounds  Extremities: warm dry without cyanosis clubbing or edema  Neuro: AAOx3, focal  Psych: Pleasant, appropriate mood and affect  Data Reviewed: I have personally reviewed following labs and imaging studies  CBC: Recent Labs  Lab 06/13/18 1007 06/14/18 0355 06/15/18 0450 06/16/18 1302  WBC 8.9 8.7  --  9.6  NEUTROABS 7.7  --   --   --   HGB 9.4* 8.4* 8.8* 8.2*  HCT 29.2* 25.2* 26.0* 25.5*  MCV 95.1 92.0  --  93.1  PLT 289 269  --  865   Basic Metabolic Panel: Recent Labs  Lab 06/13/18 1007 06/14/18 0355 06/15/18 0450 06/16/18 1302  NA 138 137 135 131*  K 4.7 4.5 4.1 4.2  CL 101 104 100 92*  CO2 24 22 25 25   GLUCOSE 94 86 116* 147*  BUN 19 26* 18 50*  CREATININE 3.87* 4.87* 3.39* 5.47*  CALCIUM 8.7* 7.9* 8.9 8.5*  PHOS  --   --  4.8* 5.8*   GFR: Estimated Creatinine Clearance: 5.5 mL/min (A) (by C-G formula based on SCr of 5.47 mg/dL (H)). Liver Function Tests: Recent Labs  Lab 06/13/18 1007 06/15/18 0450 06/16/18 1302  AST 22  --   --   ALT 13  --   --   ALKPHOS 333*  --   --   BILITOT 0.3  --   --   PROT 7.0  --   --   ALBUMIN 3.6 2.9* 2.9*   Recent Labs  Lab 06/13/18 1007  LIPASE 78*   No results for input(s): AMMONIA in the last 168 hours. Coagulation Profile: No results for input(s): INR, PROTIME in the last 168 hours. Cardiac Enzymes: No results for input(s): CKTOTAL, CKMB, CKMBINDEX, TROPONINI in the last 168 hours. BNP (last 3 results) No results for input(s): PROBNP in the last 8760 hours. HbA1C: No results for input(s): HGBA1C in the last 72 hours. CBG: Recent Labs  Lab 06/16/18 0811 06/16/18 1329  06/16/18 1658 06/16/18 2223 06/17/18 0801  GLUCAP 117* 121* 109* 154* 117*   Lipid Profile: No results for input(s): CHOL, HDL, LDLCALC, TRIG, CHOLHDL, LDLDIRECT in the last 72 hours. Thyroid Function Tests: No results for input(s): TSH, T4TOTAL, FREET4, T3FREE, THYROIDAB in the last 72 hours. Anemia Panel: No results for input(s): VITAMINB12, FOLATE, FERRITIN, TIBC, IRON, RETICCTPCT in the last 72 hours. Urine analysis:    Component Value Date/Time   COLORURINE STRAW (A) 06/13/2018 1540   APPEARANCEUR CLEAR 06/13/2018 1540   LABSPEC 1.006 06/13/2018 1540   PHURINE 9.0 (H) 06/13/2018 1540   GLUCOSEU 50 (A) 06/13/2018 1540   HGBUR NEGATIVE 06/13/2018 1540   BILIRUBINUR NEGATIVE 06/13/2018 1540   KETONESUR NEGATIVE 06/13/2018 1540   PROTEINUR 100 (A) 06/13/2018 1540   NITRITE NEGATIVE 06/13/2018 1540   LEUKOCYTESUR NEGATIVE 06/13/2018 1540   Sepsis Labs: @LABRCNTIP (procalcitonin:4,lacticidven:4)  ) Recent Results (from the past 240  hour(s))  Urine culture     Status: Abnormal   Collection Time: 06/13/18  3:37 PM  Result Value Ref Range Status   Specimen Description URINE, CLEAN CATCH  Final   Special Requests   Final    NONE Performed at [facility], [address], [place], [place] [address]    Culture MULTIPLE SPECIES PRESENT, SUGGEST RECOLLECTION (A)  Final   Report Status 06/14/2018 FINAL  Final  MRSA PCR Screening     Status: None   Collection Time: 06/13/18  8:34 PM  Result Value Ref Range Status   MRSA by PCR NEGATIVE NEGATIVE Final    Comment:        The GeneXpert MRSA Assay (FDA approved for NASAL specimens only), is one component of a comprehensive MRSA colonization surveillance program. It is not intended to diagnose MRSA infection nor to guide or monitor treatment for MRSA infections. Performed at [facility], [place] [address] [place], [place] [address]       Radiology Studies: No results found.   Scheduled Meds: . amLODipine  5  mg Oral Daily  . atorvastatin  40 mg Oral QHS  . Chlorhexidine Gluconate Cloth  6 each Topical Q0600  . darbepoetin (ARANESP) injection - DIALYSIS  60 mcg Intravenous Q Sat-HD  . feeding supplement (NEPRO CARB STEADY)  237 mL Oral BID BM  . heparin  5,000 Units Subcutaneous Q8H  . insulin aspart  0-9 Units Subcutaneous TID WC  . sodium chloride flush  3 mL Intravenous Q12H   Continuous Infusions: . sodium chloride       LOS: 0 days   Time Spent in minutes   30 minutes  Kamaljit Hizer D.O. on 06/17/2018 at 9:50 AM  Between 7am to 7pm - Please see pager noted on [email or web address]  After 7pm go to [email or web address]  And look for the night coverage person covering for me after hours  [facility] Office  [phone]

## 2018-06-18 DIAGNOSIS — I10 Essential (primary) hypertension: Secondary | ICD-10-CM | POA: Diagnosis not present

## 2018-06-18 DIAGNOSIS — E162 Hypoglycemia, unspecified: Secondary | ICD-10-CM | POA: Diagnosis not present

## 2018-06-18 DIAGNOSIS — E118 Type 2 diabetes mellitus with unspecified complications: Secondary | ICD-10-CM | POA: Diagnosis not present

## 2018-06-18 DIAGNOSIS — N186 End stage renal disease: Secondary | ICD-10-CM | POA: Diagnosis not present

## 2018-06-18 LAB — CBC
HEMATOCRIT: 22.9 % — AB (ref 36.0–46.0)
HEMOGLOBIN: 7.8 g/dL — AB (ref 12.0–15.0)
MCH: 31.1 pg (ref 26.0–34.0)
MCHC: 34.1 g/dL (ref 30.0–36.0)
MCV: 91.2 fL (ref 80.0–100.0)
Platelets: 272 10*3/uL (ref 150–400)
RBC: 2.51 MIL/uL — AB (ref 3.87–5.11)
RDW: 14.6 % (ref 11.5–15.5)
WBC: 10.7 10*3/uL — ABNORMAL HIGH (ref 4.0–10.5)
nRBC: 0 % (ref 0.0–0.2)

## 2018-06-18 LAB — RENAL FUNCTION PANEL
ANION GAP: 14 (ref 5–15)
Albumin: 2.8 g/dL — ABNORMAL LOW (ref 3.5–5.0)
BUN: 47 mg/dL — ABNORMAL HIGH (ref 8–23)
CO2: 24 mmol/L (ref 22–32)
Calcium: 8.6 mg/dL — ABNORMAL LOW (ref 8.9–10.3)
Chloride: 92 mmol/L — ABNORMAL LOW (ref 98–111)
Creatinine, Ser: 4.59 mg/dL — ABNORMAL HIGH (ref 0.44–1.00)
GFR calc non Af Amer: 8 mL/min — ABNORMAL LOW (ref >60)
GFR, EST AFRICAN AMERICAN: 9 mL/min — AB (ref >60)
Glucose, Bld: 117 mg/dL — ABNORMAL HIGH (ref 70–99)
Phosphorus: 5.1 mg/dL — ABNORMAL HIGH (ref 2.5–4.6)
Potassium: 3.9 mmol/L (ref 3.5–5.1)
Sodium: 130 mmol/L — ABNORMAL LOW (ref 135–145)

## 2018-06-18 LAB — GLUCOSE, CAPILLARY
GLUCOSE-CAPILLARY: 180 mg/dL — AB (ref 70–99)
Glucose-Capillary: 120 mg/dL — ABNORMAL HIGH (ref 70–99)
Glucose-Capillary: 133 mg/dL — ABNORMAL HIGH (ref 70–99)
Glucose-Capillary: 109 mg/dL — ABNORMAL HIGH (ref 70–99)

## 2018-06-18 MED ORDER — NEPRO/CARBSTEADY PO LIQD: 237.0000 mL | Freq: Two times a day (BID) | ORAL | 0 refills | Status: AC

## 2018-06-18 MED ORDER — ACETAMINOPHEN 325 MG PO TABS: 650.0000 mg | ORAL_TABLET | Freq: Four times a day (QID) | ORAL | Status: AC | PRN

## 2018-06-18 NOTE — Progress Notes (Signed)
Physical Therapy Treatment Patient Details Name: Mary Aguirre MRN: [id number] DOB: 19-May-1927 Today's Date: 06/18/2018    History of Present Illness 85 y.o. female admitted with fall, CBG 43, sustained L scapular fracture and L 4-7th rib fractures. PMH of dementia, DM, HTN, ESRD, L inferior/superior pubic rami fractures November 2019.     PT Comments    Pt performed bed mobility and transfer into standing to turn and sit in recliner.  Pt remains limited due to pain at this time.  Pt required min assistance to complete transfer.  Plan next session for progression of gait training to tolerance.  Pt continues to benefit from skilled rehab in a post acute setting to improve strength and function before returning home.    Follow Up Recommendations  SNF;Supervision/Assistance - 24 hour;Supervision for mobility/OOB     Equipment Recommendations  Other (comment)(TBD at SNF, will need WC if discharging home.  )    Recommendations for Other Services       Precautions / Restrictions Precautions Precautions: Fall Precaution Comments: per chart, fall with pelvic fracture November 2019 Restrictions Weight Bearing Restrictions: No    Mobility  Bed Mobility Overal bed mobility: Needs Assistance Bed Mobility: Supine to Sit     Supine to sit: Mod assist     General bed mobility comments: Cues for hand placement, VCs given to advance B LEs to edge of bed.  Pt slow and guarded and required assistance to elevate trunk into sitting.    Transfers Overall transfer level: Needs assistance Equipment used: 1 person hand held assist Transfers: Sit to/from Stand Sit to Stand: Min assist;+2 safety/equipment         General transfer comment: Pt able to push from bed into standing with min assistance to stedy.  Pt held to therapist hand on R side to move from bed to recliner chair.    Ambulation/Gait Ambulation/Gait assistance: Min assist;+2 safety/equipment Gait Distance (Feet): 4 Feet(steps  from bed to recliner chair ) Assistive device: 1 person hand held assist Gait Pattern/deviations: Step-to pattern;Antalgic;Trunk flexed;Shuffle     General Gait Details: Pt required assistance with series of side stepping and turning/backing to recliner chair.     Stairs             Wheelchair Mobility    Modified Rankin (Stroke Patients Only)       Balance                                            Cognition Arousal/Alertness: Awake/alert Behavior During Therapy: WFL for tasks assessed/performed Overall Cognitive Status: Within Functional Limits for tasks assessed                                        Exercises      General Comments        Pertinent Vitals/Pain Pain Assessment: Faces Faces Pain Scale: Hurts even more Pain Location: L shoulder Pain Descriptors / Indicators: Grimacing;Guarding Pain Intervention(s): Monitored during session;Repositioned    Home Living                      Prior Function            PT Goals (current goals can now be found in the care plan section) Acute  Rehab PT Goals Patient Stated Goal: decrease pain Potential to Achieve Goals: Fair Progress towards PT goals: Progressing toward goals    Frequency    Min 3X/week      PT Plan Current plan remains appropriate    Co-evaluation              AM-PAC PT "6 Clicks" Mobility   Outcome Measure  Help needed turning from your back to your side while in a flat bed without using bedrails?: Total Help needed moving from lying on your back to sitting on the side of a flat bed without using bedrails?: Total Help needed moving to and from a bed to a chair (including a wheelchair)?: Total Help needed standing up from a chair using your arms (e.g., wheelchair or bedside chair)?: Total Help needed to walk in hospital room?: Total Help needed climbing 3-5 steps with a railing? : Total 6 Click Score: 6    End of Session  Equipment Utilized During Treatment: Gait belt Activity Tolerance: Patient limited by pain Patient left: in bed;with bed alarm set;with call bell/phone within reach Nurse Communication: Mobility status;Patient requests pain meds PT Visit Diagnosis: History of falling (Z91.81);Repeated falls (R29.6);Difficulty in walking, not elsewhere classified (R26.2);Other abnormalities of gait and mobility (R26.89);Pain Pain - Right/Left: Left Pain - part of body: Shoulder     Time: 2863-8177 PT Time Calculation (min) (ACUTE ONLY): 9 min  Charges:  $Gait Training: 8-22 mins                     Governor Rooks, PTA Acute Rehabilitation Services Pager [phone] Office [phone]     Gerold Sar Eli Hose 06/18/2018, 1:50 PM

## 2018-06-18 NOTE — Discharge Summary (Signed)
Physician Discharge Summary  Mary Aguirre VEH:[id number] DOB: Apr 25, 1927 DOA: 06/13/2018  PCP: Mary Leach, MD  Admit date: 06/13/2018 Discharge date: 06/18/2018  Time spent: 45 minutes  Recommendations for Outpatient Follow-up:  Patient will be discharged to skilled nursing facility, continue physical and occupational therapy. Continue hemodialysis as scheduled.  Patient will need to follow up with primary care provider within one week of discharge.  Patient should continue medications as prescribed.  Patient should follow a renal diet.   Discharge Diagnoses:  Principal problem: Fall withscapularfracture Diabetes mellitus, type II complicated by hypoglycemia  Essential hypertension ESRD Adrenal mass Moderate malnutrition  Discharge Condition: Stable  Diet recommendation: renal  Filed Weights   06/16/18 0511 06/16/18 1235 06/16/18 1632  Weight: 52.6 kg 52.7 kg 50.9 kg    History of present illness:  on 06/13/2018 Mary Bains Youngis a 85 y.o.femalewith a medical history ofdiabetes, ESRD, hypertension, who presented to the emergency department after falling this morning approximately 3 times. Patient remembers hitting her shoulder on the toilet. She denies losing consciousness or hitting her head. EMS was called, upon arrival patient was found to have a blood sugar of 43, and she was given dextrose. Patient received her last dose of insulin the evening of 06/12/2018. She denies any chest pain, shortness of breath, abdominal pain, nausea or vomiting, diarrhea or constipation, dizziness or headache, recent illness or travel. Her daughter tells me she has dementia.She only complains of having left shoulder pain at this time.  Hospital Course:  Fall withscapularfracture -Suspect secondary to hypoglycemia -CT chest showed comminuted mildly displaced fracture of the body of the left scapula. Suspected nondisplaced fractures of the left fourth through seventh ribs. T9 superior  endplate compression fracture new since 2006 however has nonacute appearance. -Orthopedic surgery consulted,CT scan ordered. Hoping to manage nonoperatively however patient may need sling for comfort. Pressure will need to follow-up with Dr. Griffin Basil in 2 to 3 weeks. -CT showed comminuted and moderately displaced fracture of the left scapular body.  No involvement of the glenoid, coracoid process or acromion.  Proximal humerus is intact and located. -Continue pain control- patient has used oral medications,oxycodone  -PT/OT consulted and recommended SNF.   -Social work consulted  Diabetes mellitus, type II complicated by hypoglycemia  -Patient takes Levemir 6 units nightly, last dose was 06/12/2018 evening -Patient was found a blood sugar in the 40s upon arrival of EMS, she was given D50 -Hold Levemir, placed on insulin sliding scale with CBG monitoring -Last hemoglobin A1c was 5.6 04/19/2018 -Given that hemoglobin A1c is 5.6, and blood sugars have been low to normal, do not feel patient should be discharged with any insulin. -CBGs have been very well controlled  Essential hypertension -Continue amlodipine, hold Bumex  ESRD -Patient dialyzes Tuesday, Thursday, Saturday -Nephrology consulted and appreciated -Patient did dialyze on 06/16/2018  Adrenal mass -CT noted left adrenal mass which is slowly enlarging since 2006 -Continue outpatient follow-up  Moderate malnutrition -Nutrition consulted, continue supplements  Procedures: None  Consultations: Orthopedic surgery Nephrology  Discharge Exam: Vitals:   06/17/18 2135 06/18/18 0533  BP: 130/66 (!) 150/55  Pulse: (!) 104 (!) 102  Resp: 20 18  Temp: 97.9 F (36.6 C) 97.8 F (36.6 C)  SpO2: 99% 97%     General: Well developed, well nourished, NAD, appears stated age  HEENT: NCAT, mucous membranes moist.  Neck: Supple  Cardiovascular: S1 S2 auscultated, RRR, no murmur  Respiratory: Clear to auscultation  bilaterally with equal chest rise  Abdomen: Soft,  nontender, nondistended, + bowel sounds  Extremities: warm dry without cyanosis clubbing or edema  Neuro: AAOx3, nonfocal  Psych: Pleasant, appropriate mood and affect  Discharge Instructions Discharge Instructions    Discharge instructions   Complete by:  As directed    Patient will be discharged to skilled nursing facility, continue physical and occupational therapy. Continue hemodialysis as scheduled.  Patient will need to follow up with primary care provider within one week of discharge.  Patient should continue medications as prescribed.  Patient should follow a renal diet.     Allergies as of 06/18/2018      Reactions   Cinacalcet Other (See Comments)   Ramipril Other (See Comments)   unknown   Iohexol     Desc: hives;pt pre-medicated with 13 hr prep did fine-02/15/05      Medication List    STOP taking these medications   insulin detemir 100 UNIT/ML injection Commonly known as:  LEVEMIR     TAKE these medications   acetaminophen 325 MG tablet Commonly known as:  TYLENOL Take 2 tablets (650 mg total) by mouth every 6 (six) hours as needed for mild pain (or Fever >/= 101).   amLODipine 5 MG tablet Commonly known as:  NORVASC Take 5 mg by mouth daily.   atorvastatin 40 MG tablet Commonly known as:  LIPITOR Take 40 mg by mouth at bedtime.   bumetanide 2 MG tablet Commonly known as:  BUMEX Take 2 mg by mouth daily.   feeding supplement (NEPRO CARB STEADY) Liqd Take 237 mLs by mouth 2 (two) times daily between meals.      Allergies  Allergen Reactions  . Cinacalcet Other (See Comments)  . Ramipril Other (See Comments)    unknown  . Iohexol      Desc: hives;pt pre-medicated with 13 hr prep did fine-02/15/05     Contact information for follow-up providers    Mary Leach, MD. Schedule an appointment as soon as possible for a visit in 1 week(s).   Specialty:  Internal Medicine Why:  Hospital follow  up Contact information: [address] [place] [address] [phone]            Contact information for after-discharge care    Destination    [facility] .   Service:  Skilled Nursing Contact information: [place] [phone]                   The results of significant diagnostics from this hospitalization (including imaging, microbiology, ancillary and laboratory) are listed below for reference.    Significant Diagnostic Studies: Dg Chest 1 View  Result Date: 06/13/2018 CLINICAL DATA:  Shoulder pain. EXAM: CHEST  1 VIEW COMPARISON:  04/02/2018 FINDINGS: Stable cardiac silhouette. Central venous line unchanged. No effusion, infiltrate pneumothorax. There is a linear lucency along the medial border of the LEFT scapular body incompletely imaged. IMPRESSION: Concern for LEFT scapular fracture. Recommend CT LEFT shoulder or at minimum LEFT shoulder radiographs three views. Electronically Signed   By: Suzy Bouchard M.D.   On: 06/13/2018 11:32   Dg Elbow Complete Left  Result Date: 06/13/2018 CLINICAL DATA:  Multiple falls this morning. Left arm pain. Initial encounter. EXAM: LEFT ELBOW - COMPLETE 3+ VIEW COMPARISON:  None. FINDINGS: There is no evidence of fracture, dislocation, or joint effusion. Osteopenic appearance. Irregular appearance of the olecranon process, likely enthesopathy. The patient has end-stage renal disease and there is acromioclavicular joint widening and irregularity.  The overlying soft tissues are not clearly thickened when allowing for skin folds. IMPRESSION: 1. No acute fracture. 2. Irregular appearance of the olecranon process, favor renal osteodystrophy. Electronically Signed   By: Monte Fantasia M.D.   On: 06/13/2018 11:21   Ct Chest Wo Contrast  Result Date: 06/13/2018 CLINICAL DATA:  85 year old female with multiple falls. Left shoulder pain, scapula fracture. EXAM: CT CHEST  WITHOUT CONTRAST TECHNIQUE: Multidetector CT imaging of the chest was performed following the standard protocol without IV contrast. COMPARISON:  Chest and shoulder radiographs from earlier today. Thoracic spine radiographs 01/27/2018. Chest CT 02/15/2005. FINDINGS: Cardiovascular: Right IJ approach dual lumen dialysis type catheter. Extensive Calcified aortic atherosclerosis. Calcified coronary artery atherosclerosis. Vascular patency is not evaluated in the absence of IV contrast. Mild cardiomegaly. Mild pericardial effusion is new since 2006. Mediastinum/Nodes: No mediastinal lymphadenopathy. Lungs/Pleura: Trace bilateral pleural fluid. No pneumothorax. Major airways are clear. Mild centrilobular emphysema is chronic and stable since 2006. Minimal to mild bilateral pulmonary atelectasis. Upper Abdomen: Partially visible and enlarged chronic left adrenal mass which was 31 millimeters diameter in 2006 and now at least 41 millimeters. This has become partially calcified (series 3, image 153). Otherwise negative visible noncontrast upper abdominal viscera. Musculoskeletal: Comminuted mildly displaced fracture of the body of the left scapula (sagittal image 81). The visible left clavicle and humerus remain intact. Visible right shoulder osseous structures are intact. Nondisplaced left anterolateral 4th through 7th rib fractures are suspected (series 4, image 72). No displaced rib fracture identified. An inferior endplate fracture of T8 appears stable since September. A superior endplate deformity of T9 seems new since then but has a nonacute appearance now. Mild T10 inferior endplate deformity appears chronic and stable. IMPRESSION: 1. Comminuted mildly displaced fracture of the body of the left scapula. Suspect nondisplaced fractures of the left 4th through 7th ribs. 2. Mild cardiomegaly and pericardial effusion. Trace bilateral pleural fluid. 3. T9 superior endplate compression fracture is new since 2006 but has a  non-acute appearance. 4. Slowly enlarging left adrenal mass since 2006 is of indeterminate histology, but unlikely malignant given the prolonged time course. Electronically Signed   By: Genevie Ann M.D.   On: 06/13/2018 16:18   Dg Shoulder Left  Result Date: 06/13/2018 CLINICAL DATA:  Fall this morning with left shoulder pain EXAM: LEFT SHOULDER - 2+ VIEW COMPARISON:  None. FINDINGS: Comminuted scapular body fracture with apex dorsal angulation towards the inferior angle. Acromioclavicular widening and prominent osteopenia, suspect renal osteodystrophy. Cardiopulmonary findings as described on dedicated chest x-ray. IMPRESSION: Comminuted and displaced scapular body fracture. Electronically Signed   By: Monte Fantasia M.D.   On: 06/13/2018 11:23   Dg Humerus Left  Result Date: 06/13/2018 CLINICAL DATA:  Fall this morning with left shoulder and upper arm pain. EXAM: LEFT HUMERUS - 2+ VIEW COMPARISON:  None. FINDINGS: Left scapular body fracture better visualized on dedicated shoulder study. Cortical deformity of the proximal humeral shaft, likely from old trauma. No acute humeral fracture. Osteopenia and mild widening/erosion at the acromioclavicular joint-likely renal osteodystrophy. IMPRESSION: Displaced scapular body fracture, reference dedicated shoulder series. Electronically Signed   By: Monte Fantasia M.D.   On: 06/13/2018 11:22   Ct No Charge  Result Date: 06/13/2018 CLINICAL DATA:  Shoulder pain after falling. Left scapular fracture on chest CT. EXAM: CT OF THE UPPER LEFT EXTREMITY WITHOUT CONTRAST TECHNIQUE: Multidetector reformatted CT images of the left shoulder were generated from the chest CT performed earlier. COMPARISON:  Radiographs same date. FINDINGS: Bones/Joint/Cartilage  Again demonstrated is a comminuted and moderately displaced fracture of the left scapular body. There is no involvement of the glenoid. The coracoid process and acromion are intact. The humeral head is located. No  evidence of proximal left humeral fracture. No evidence of chest wall or left clavicle injury. There are underlying sternoclavicular acromioclavicular degenerative changes. Ligaments Suboptimally assessed by CT. Muscles and Tendons Soft tissue swelling around the scapula involving the subscapularis, infraspinatus and teres minor muscles. No focal hematoma identified. Soft tissues Surgical clips in the left axilla from previous axillary node dissection. No adenopathy or focal hematoma identified. IMPRESSION: 1. Comminuted and moderately displaced fracture of the left scapular body. No involvement of glenoid, coracoid process or acromion. 2. The proximal humerus is intact and located. Electronically Signed   By: Richardean Sale M.D.   On: 06/13/2018 18:33    Microbiology: Recent Results (from the past 240 hour(s))  Urine culture     Status: Abnormal   Collection Time: 06/13/18  3:37 PM  Result Value Ref Range Status   Specimen Description URINE, CLEAN CATCH  Final   Special Requests   Final    NONE Performed at [facility], [place] [address] [place], [place] [address]    Culture MULTIPLE SPECIES PRESENT, SUGGEST RECOLLECTION (A)  Final   Report Status 06/14/2018 FINAL  Final  MRSA PCR Screening     Status: None   Collection Time: 06/13/18  8:34 PM  Result Value Ref Range Status   MRSA by PCR NEGATIVE NEGATIVE Final    Comment:        The GeneXpert MRSA Assay (FDA approved for NASAL specimens only), is one component of a comprehensive MRSA colonization surveillance program. It is not intended to diagnose MRSA infection nor to guide or monitor treatment for MRSA infections. Performed at [facility], [place] [address], [place], [place] [address]      Labs: Basic Metabolic Panel: Recent Labs  Lab 06/13/18 1007 06/14/18 0355 06/15/18 0450 06/16/18 1302 06/18/18 0406  NA 138 137 135 131* 130*  K 4.7 4.5 4.1 4.2 3.9  CL 101 104 100 92* 92*  CO2 24 22 25 25 24   GLUCOSE 94  86 116* 147* 117*  BUN 19 26* 18 50* 47*  CREATININE 3.87* 4.87* 3.39* 5.47* 4.59*  CALCIUM 8.7* 7.9* 8.9 8.5* 8.6*  PHOS  --   --  4.8* 5.8* 5.1*   Liver Function Tests: Recent Labs  Lab 06/13/18 1007 06/15/18 0450 06/16/18 1302 06/18/18 0406  AST 22  --   --   --   ALT 13  --   --   --   ALKPHOS 333*  --   --   --   BILITOT 0.3  --   --   --   PROT 7.0  --   --   --   ALBUMIN 3.6 2.9* 2.9* 2.8*   Recent Labs  Lab 06/13/18 1007  LIPASE 78*   No results for input(s): AMMONIA in the last 168 hours. CBC: Recent Labs  Lab 06/13/18 1007 06/14/18 0355 06/15/18 0450 06/16/18 1302 06/18/18 0406  WBC 8.9 8.7  --  9.6 10.7*  NEUTROABS 7.7  --   --   --   --   HGB 9.4* 8.4* 8.8* 8.2* 7.8*  HCT 29.2* 25.2* 26.0* 25.5* 22.9*  MCV 95.1 92.0  --  93.1 91.2  PLT 289 269  --  258 272   Cardiac Enzymes: No results for input(s):  CKTOTAL, CKMB, CKMBINDEX, TROPONINI in the last 168 hours. BNP: BNP (last 3 results) No results for input(s): BNP in the last 8760 hours.  ProBNP (last 3 results) No results for input(s): PROBNP in the last 8760 hours.  CBG: Recent Labs  Lab 06/17/18 1245 06/17/18 1647 06/17/18 2132 06/18/18 0814 06/18/18 1156  GLUCAP 139* 161* 136* 120* 133*       Signed:  Sophina Mitten  [facility] 06/18/2018, 1:52 PM

## 2018-06-18 NOTE — Discharge Instructions (Signed)
Hypoglycemia °Hypoglycemia is when the sugar (glucose) level in your blood is too low. Signs of low blood sugar may include: °· Feeling: °? Hungry. °? Worried or nervous (anxious). °? Sweaty and clammy. °? Confused. °? Dizzy. °? Sleepy. °? Sick to your stomach (nauseous). °· Having: °? A fast heartbeat. °? A headache. °? A change in your vision. °? Tingling or no feeling (numbness) around your mouth, lips, or tongue. °? Jerky movements that you cannot control (seizure). °· Having trouble with: °? Moving (coordination). °? Sleeping. °? Passing out (fainting). °? Getting upset easily (irritability). °Low blood sugar can happen to people who have diabetes and people who do not have diabetes. Low blood sugar can happen quickly, and it can be an emergency. °Treating low blood sugar °Low blood sugar is often treated by eating or drinking something sugary right away, such as: °· Fruit juice, 4-6 oz (120-150 mL). °· Regular soda (not diet soda), 4-6 oz (120-150 mL). °· Low-fat milk, 4 oz (120 mL). °· Several pieces of hard candy. °· Sugar or honey, 1 Tbsp (15 mL). °Treating low blood sugar if you have diabetes °If you can think clearly and swallow safely, follow the 15:15 rule: °· Take 15 grams of a fast-acting carb (carbohydrate). Talk with your doctor about how much you should take. °· Always keep a source of fast-acting carb with you, such as: °? Sugar tablets (glucose pills). Take 3-4 pills. °? 6-8 pieces of hard candy. °? 4-6 oz (120-150 mL) of fruit juice. °? 4-6 oz (120-150 mL) of regular (not diet) soda. °? 1 Tbsp (15 mL) honey or sugar. °· Check your blood sugar 15 minutes after you take the carb. °· If your blood sugar is still at or below 70 mg/dL (3.9 mmol/L), take 15 grams of a carb again. °· If your blood sugar does not go above 70 mg/dL (3.9 mmol/L) after 3 tries, get help right away. °· After your blood sugar goes back to normal, eat a meal or a snack within 1 hour. ° °Treating very low blood sugar °If your  blood sugar is at or below 54 mg/dL (3 mmol/L), you have very low blood sugar (severe hypoglycemia). This may also cause: °· Passing out. °· Jerky movements you cannot control (seizure). °· Losing consciousness (coma). °This is an emergency. Do not wait to see if the symptoms will go away. Get medical help right away. Call your local emergency services (911 in the [place] Do not drive yourself to the hospital. °If you have very low blood sugar and you cannot eat or drink, you may need a glucagon shot (injection). A family member or friend should learn how to check your blood sugar and how to give you a glucagon shot. Ask your doctor if you need to have a glucagon shot kit at home. °Follow these instructions at home: °General instructions °· Take over-the-counter and prescription medicines only as told by your doctor. °· Stay aware of your blood sugar as told by your doctor. °· Limit alcohol intake to no more than 1 drink a day for nonpregnant women and 2 drinks a day for men. One drink equals 12 oz of beer (355 mL), 5 oz of wine (148 mL), or 1½ oz of hard liquor (44 mL). °· Keep all follow-up visits as told by your doctor. This is important. °If you have diabetes: ° °· Follow your diabetes care plan as told by your doctor. Make sure you: °? Know the signs of low blood sugar. °?   Take your medicines as told. ? Follow your exercise and meal plan. ? Eat on time. Do not skip meals. ? Check your blood sugar as often as told by your doctor. Always check it before and after exercise. ? Follow your sick day plan when you cannot eat or drink normally. Make this plan ahead of time with your doctor.  Share your diabetes care plan with: ? Your work or school. ? People you live with.  Check your pee (urine) for ketones: ? When you are sick. ? As told by your doctor.  Carry a card or wear jewelry that says you have diabetes. Contact a doctor if:  You have trouble keeping your blood sugar in your target  range.  You have low blood sugar often. Get help right away if:  You still have symptoms after you eat or drink something sugary.  Your blood sugar is at or below 54 mg/dL (3 mmol/L).  You have jerky movements that you cannot control.  You pass out. These symptoms may be an emergency. Do not wait to see if the symptoms will go away. Get medical help right away. Call your local emergency services (911 in the [place] Do not drive yourself to the hospital. Summary  Hypoglycemia happens when the level of sugar (glucose) in your blood is too low.  Low blood sugar can happen to people who have diabetes and people who do not have diabetes. Low blood sugar can happen quickly, and it can be an emergency.  Make sure you know the signs of low blood sugar and know how to treat it.  Always keep a source of sugar (fast-acting carb) with you to treat low blood sugar. This information is not intended to replace advice given to you by your health care provider. Make sure you discuss any questions you have with your health care provider. Document Released: 08/03/2009 Document Revised: 10/31/2017 Document Reviewed: 06/12/2015 Elsevier Interactive Patient Education  2019 [employer].

## 2018-06-18 NOTE — Progress Notes (Signed)
Orthopedic Tech Progress Note Patient Details:  Mary Aguirre Oct 04, 1926 [id number] The patient was not allowing me to put on sling. she said she was in to much pain to move. I showed RN how to put it on patient so she could give her pain meds 1st. Ortho Devices Type of Ortho Device: Shoulder immobilizer Ortho Device/Splint Location: left shoulder Ortho Device/Splint Interventions: Adjustment, Application, Ordered   Post Interventions Patient Tolerated: Poor Instructions Provided: Care of device, Adjustment of device   Janit Pagan 06/18/2018, 3:01 PM

## 2018-06-18 NOTE — Progress Notes (Signed)
Pt prepared for d/c to SNF. Skin intact except as charted in most recent assessments. Vitals are stable. Report called to receiving facility. Pt to be transported by ambulance service.

## 2018-06-18 NOTE — Progress Notes (Signed)
Patient ID: Mary Aguirre, female   DOB: 1927-02-22, 85 y.o.   MRN: [id number] [facility] Progress Note   Assessment/ Plan:   1.  Left scapular fracture: Plans noted for conservative management with pain management and sling to immobilize left shoulder/scapula, plans for possible discharge to [facility] skilled nursing facility for ongoing rehabilitation (2 days accepted). 2.  End-stage renal disease: Continue dialysis on a TTS schedule, she does not have any acute indications at this time.  Put on schedule for dialysis tomorrow in case she is not discharged to [facility] today.  Right IJ TDC with no permanent access. 3.  Hypertension: Continue oral antihypertensive therapy and monitor with hemodialysis. 4.  Anemia of chronic kidney disease:  Continue hemoglobin and hematocrit monitoring on ESA. 5.  Metabolic bone disease:  Continue renal diet/phosphorus binders.  Mild hyperphosphatemia noted on labs.  Subjective:   Continues to have intermittent left shoulder pain/stiffness with decreased range of motion.  Apprehensive about doing dialysis here tomorrow.   Objective:   BP (!) 150/55 (BP Location: Right Arm)   Pulse (!) 102   Temp 97.8 F (36.6 C) (Oral)   Resp 18   Ht 5\' 2"  (1.575 m)   Wt 50.9 kg   SpO2 97%   BMI 20.52 kg/m   Physical Exam: Gen: Appears comfortable sitting up in recliner CVS: Pulse regular tachycardia, S1 and S2 normal Resp: Clear to auscultation bilaterally, no rales/rhonchi.  Right IJ TDC Abd: Soft, flat and nontender Ext: No lower extremity edema.  Labs: BMET Recent Labs  Lab 06/13/18 1007 06/14/18 0355 06/15/18 0450 06/16/18 1302 06/18/18 0406  NA 138 137 135 131* 130*  K 4.7 4.5 4.1 4.2 3.9  CL 101 104 100 92* 92*  CO2 24 22 25 25 24   GLUCOSE 94 86 116* 147* 117*  BUN 19 26* 18 50* 47*  CREATININE 3.87* 4.87* 3.39* 5.47* 4.59*  CALCIUM 8.7* 7.9* 8.9 8.5* 8.6*  PHOS  --   --  4.8* 5.8* 5.1*   CBC Recent Labs  Lab  06/13/18 1007 06/14/18 0355 06/15/18 0450 06/16/18 1302 06/18/18 0406  WBC 8.9 8.7  --  9.6 10.7*  NEUTROABS 7.7  --   --   --   --   HGB 9.4* 8.4* 8.8* 8.2* 7.8*  HCT 29.2* 25.2* 26.0* 25.5* 22.9*  MCV 95.1 92.0  --  93.1 91.2  PLT 289 269  --  258 272   Medications:    . amLODipine  5 mg Oral Daily  . atorvastatin  40 mg Oral QHS  . Chlorhexidine Gluconate Cloth  6 each Topical Q0600  . darbepoetin (ARANESP) injection - DIALYSIS  60 mcg Intravenous Q Sat-HD  . feeding supplement (NEPRO CARB STEADY)  237 mL Oral BID BM  . heparin  5,000 Units Subcutaneous Q8H  . insulin aspart  0-9 Units Subcutaneous TID WC  . sodium chloride flush  3 mL Intravenous Q12H   Elmarie Shiley, MD 06/18/2018, 11:28 AM

## 2018-06-18 NOTE — Clinical Social Work Placement (Signed)
   CLINICAL SOCIAL WORK PLACEMENT  NOTE  Date:  06/18/2018  Patient Details  Name: [name] MRN: [id number] Date of Birth: [birth year]  Clinical Social Work is seeking post-discharge placement for this patient at the [place] level of care (*CSW will initial, date and re-position this form in  chart as items are completed):  Yes   Patient/family provided with [place] Work Department's list of facilities offering this level of care within the geographic area requested by the patient (or if unable, by the patient's family).  Yes   Patient/family informed of their freedom to choose among providers that offer the needed level of care, that participate in Medicare, Medicaid or managed care program needed by the patient, have an available bed and are willing to accept the patient.  Yes   Patient/family informed of [facility]'s ownership interest in [facility] and [facility], as well as of the fact that they are under no obligation to receive care at these facilities.  PASRR submitted to EDS on       PASRR number received on       Existing PASRR number confirmed on 06/15/18     FL2 transmitted to all facilities in geographic area requested by pt/family on 06/15/18     FL2 transmitted to all facilities within larger geographic area on       Patient informed that his/her managed care company has contracts with or will negotiate with certain facilities, including the following:        Yes   Patient/family informed of bed offers received.  Patient chooses bed at [facility]     Physician recommends and patient chooses bed at      Patient to be transferred to [facility] on 06/18/18.  Patient to be transferred to facility by [facility]     Patient family notified on 06/18/18 of transfer.  Name of family member notified:  Granddaughter-Monique     PHYSICIAN       Additional Comment:     _______________________________________________ Benard Halsted, LCSW 06/18/2018, 1:58 PM

## 2018-06-18 NOTE — Progress Notes (Signed)
Patient will DC to: [facility] Anticipated DC date: 06/18/2018 Family notified: Granddaughter-Monique Transport by: Domenica Reamer   Per MD patient ready for DC to [facility]. RN, patient, patient's family, and facility notified of DC. Discharge Summary and FL2 sent to facility. RN to call report prior to discharge [phone], Ask for RN at [facility]). DC packet on chart. Ambulance transport requested for patient.   CSW will sign off for now as social work intervention is no longer needed. Please consult [place] again if new needs arise.  Cedric Fishman, LCSW Clinical Social Worker [phone]

## 2018-06-18 NOTE — Progress Notes (Signed)
Mary Aguirre has received Biochemist, clinical.   Cedric Fishman LCSW [phone]

## 2018-06-18 NOTE — Progress Notes (Signed)
CSW still awaiting insurance approval at [place].   Percell Locus Kadden Osterhout LCSW [phone]

## 2019-02-18 IMAGING — DX DG ELBOW COMPLETE 3+V*L*
4 series · 4 of 4 positions shown · non-contrast
Comparison: None.

CLINICAL DATA: Multiple falls this morning. Left arm pain. Initial
encounter.

EXAM:
LEFT ELBOW - COMPLETE 3+ VIEW

[elbow ap]
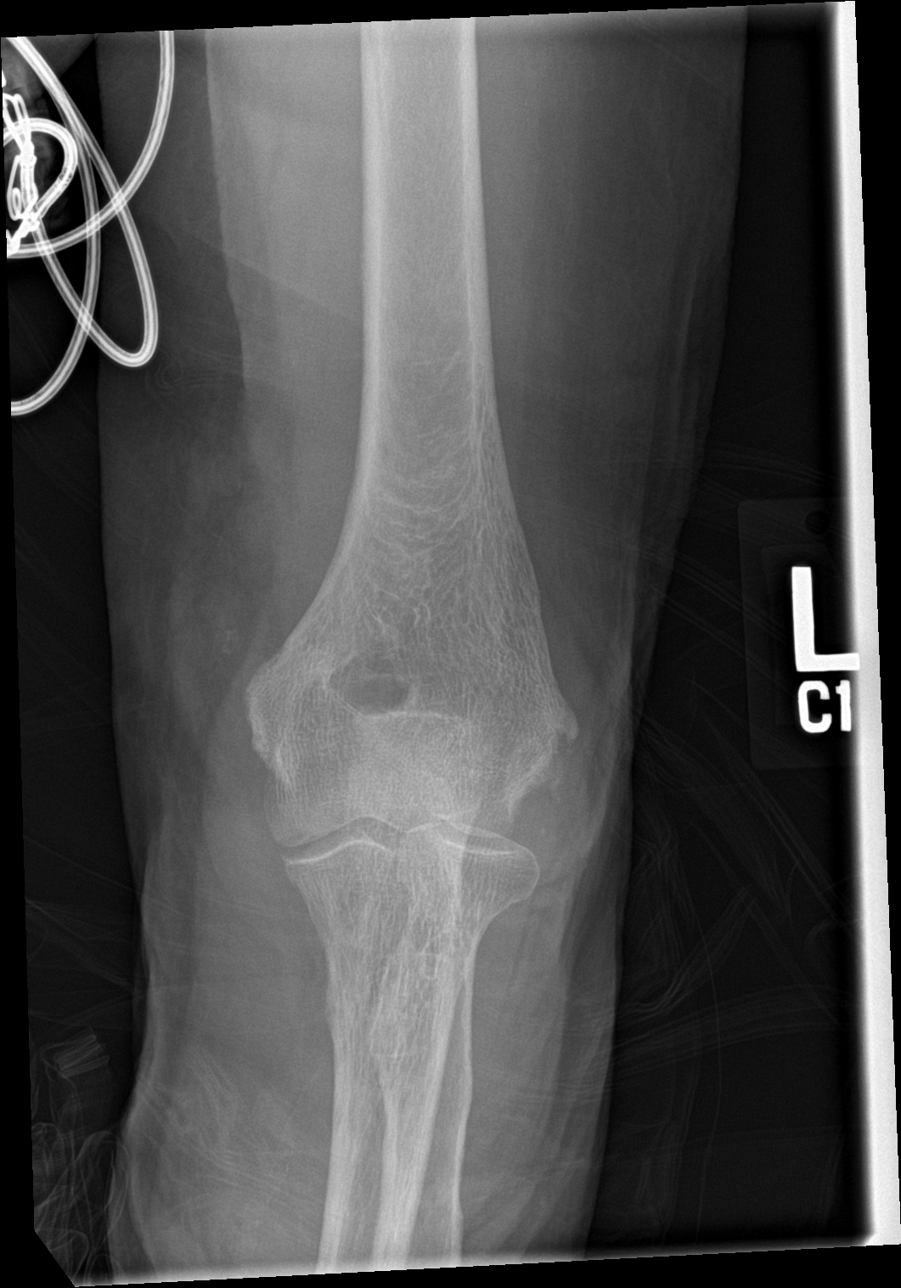

[elbow obl (1 of 2)]
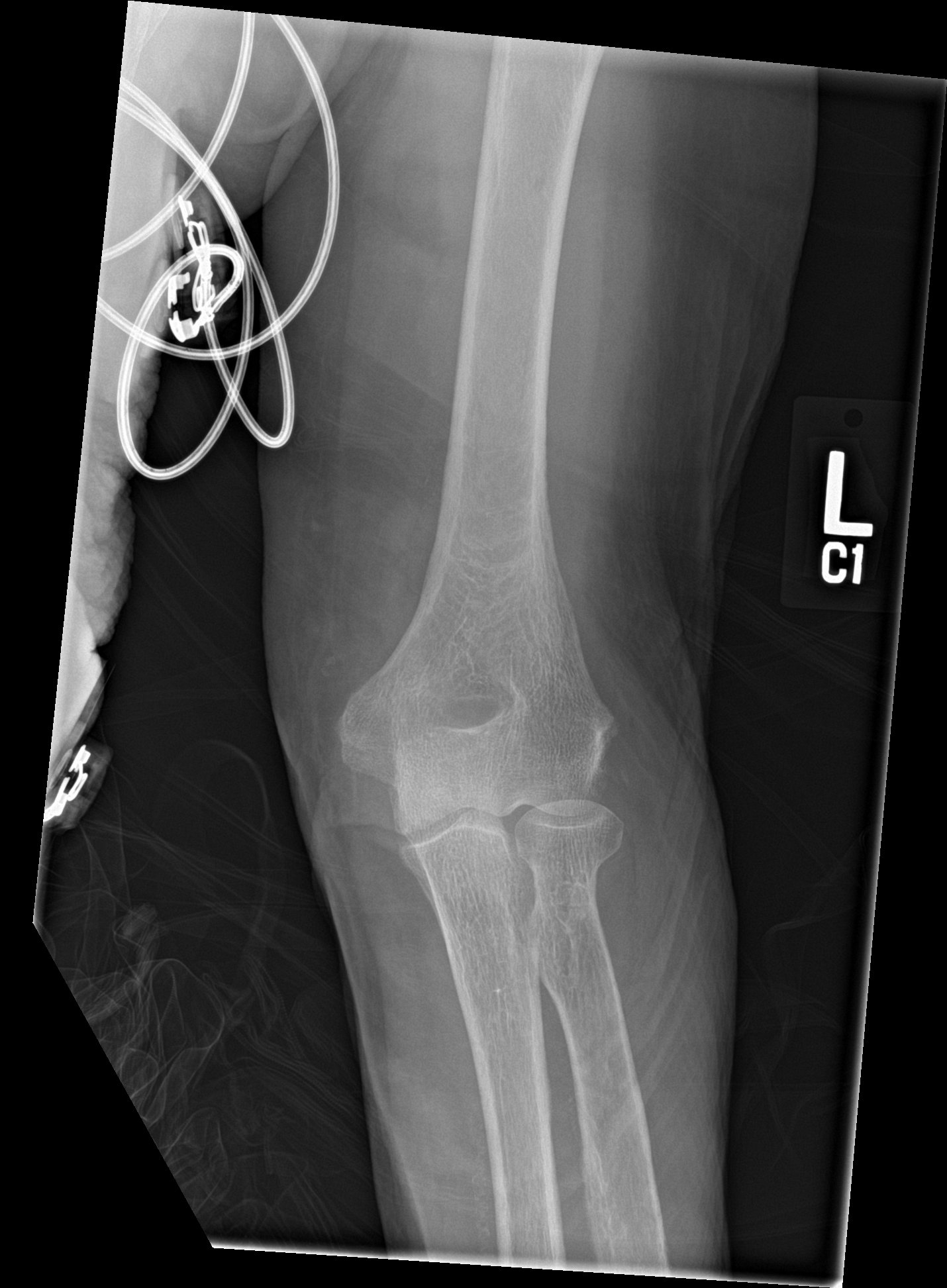

[elbow obl (2 of 2)]
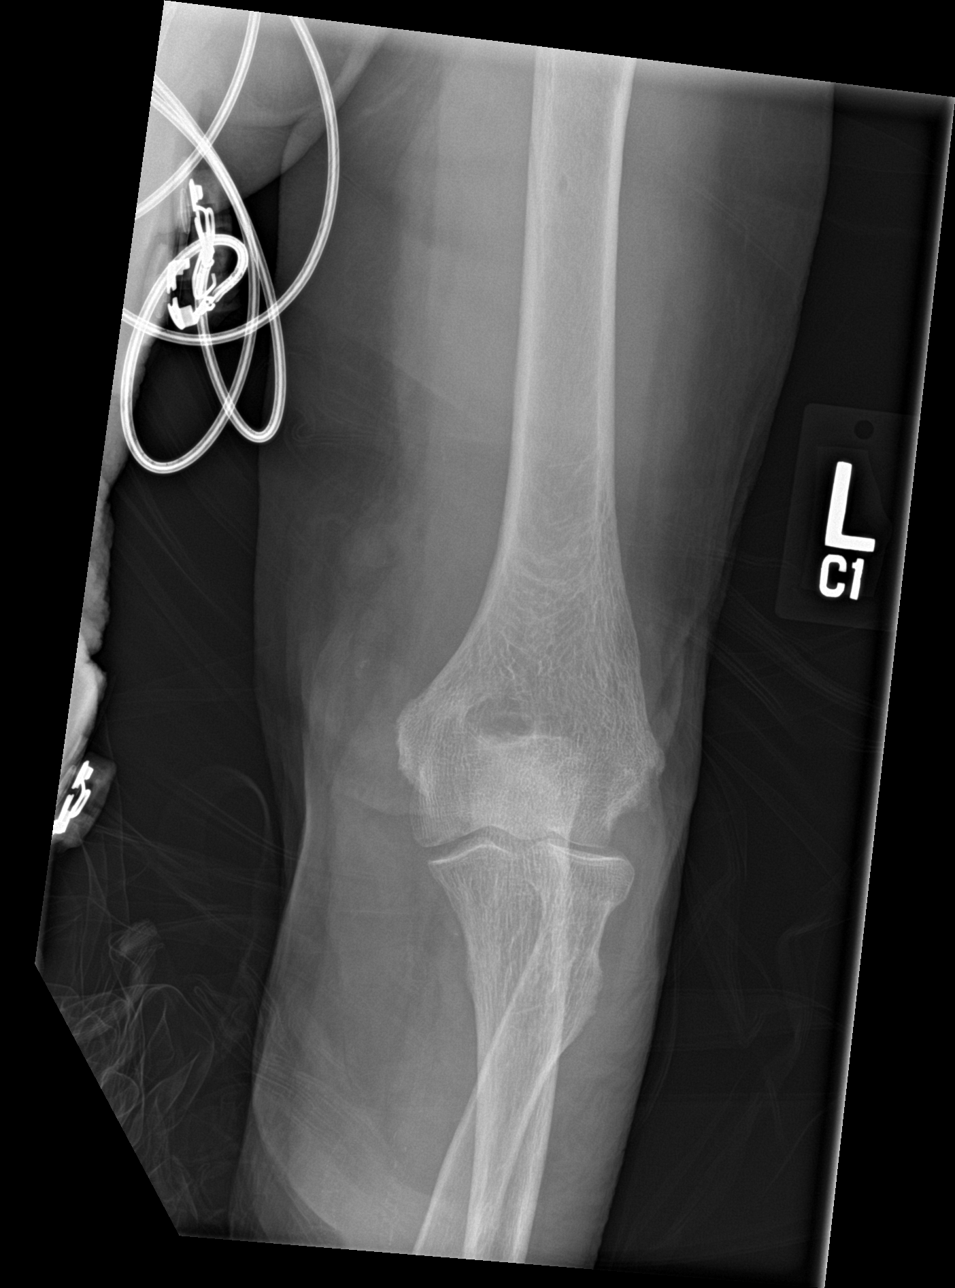

[elbow lat]
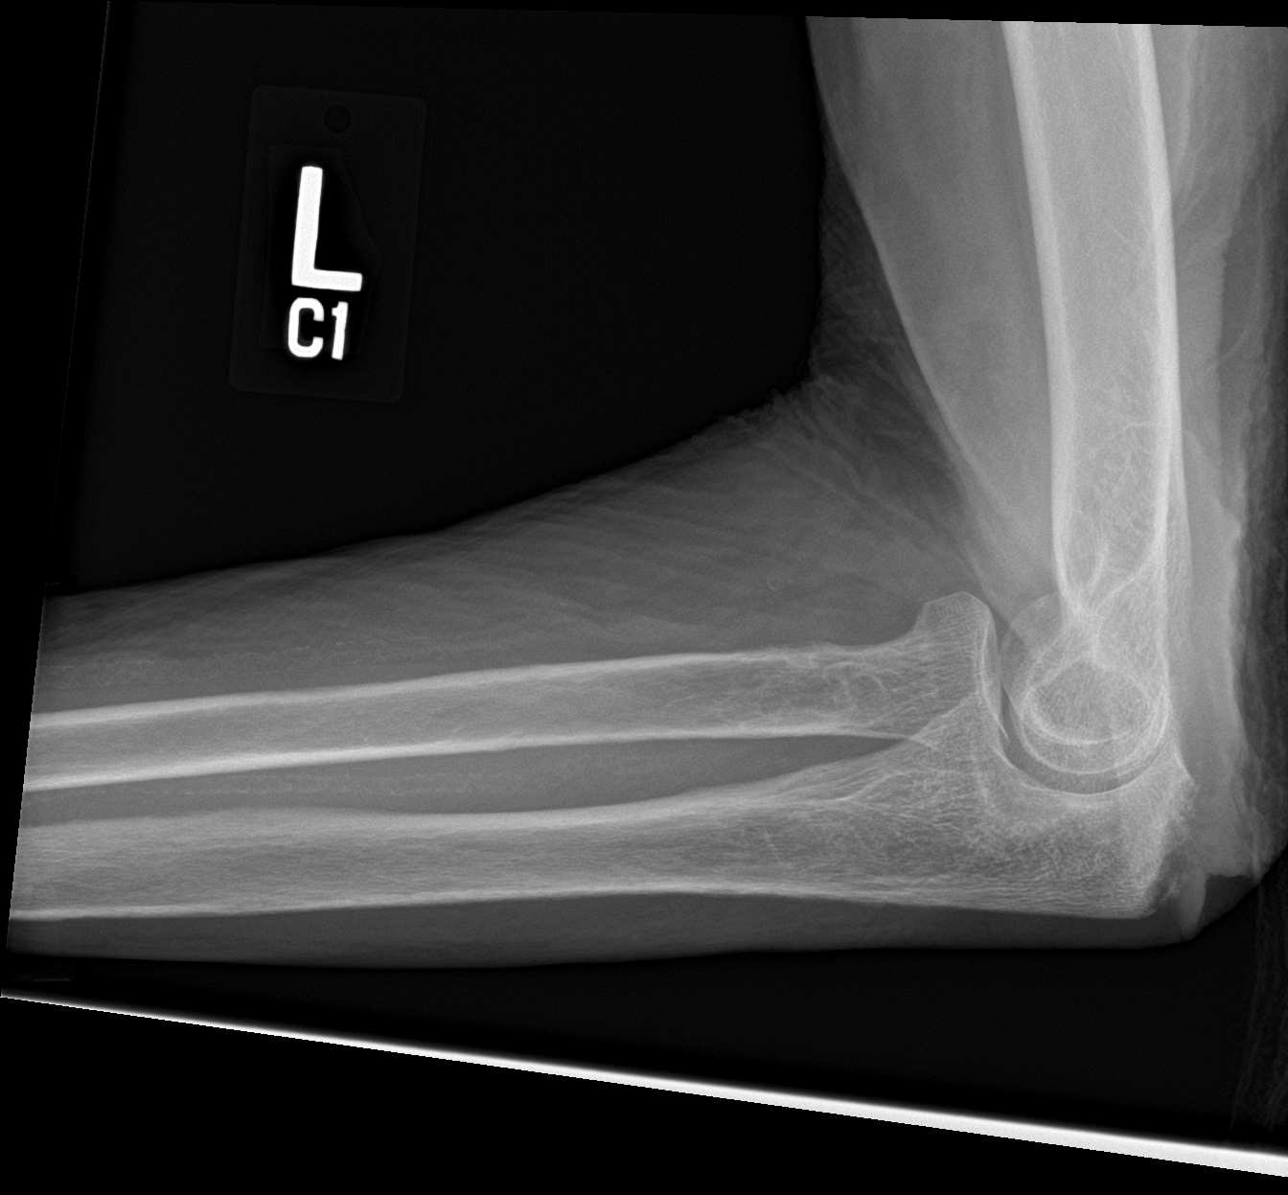

[4 of 4 positions shown; findings below may reference images not displayed]

FINDINGS: There is no evidence of fracture, dislocation, or joint effusion.
Osteopenic appearance. Irregular appearance of the olecranon
process, likely enthesopathy. The patient has end-stage renal
disease and there is acromioclavicular joint widening and
irregularity. The overlying soft tissues are not clearly thickened
when allowing for skin folds.
IMPRESSION: 1. No acute fracture.
2. Irregular appearance of the olecranon process, favor renal
osteodystrophy.

## 2019-07-22 DEATH — deceased
